# Patient Record
Sex: Female | Born: 1990 | Hispanic: Yes | Marital: Married | Smoking: Never smoker
Health system: Southern US, Community
[De-identification: ages and names within clinical notes are randomized; demographics above are authoritative.]

## PROBLEM LIST (undated history)

## (undated) DIAGNOSIS — F419 Anxiety disorder, unspecified: Secondary | ICD-10-CM

## (undated) DIAGNOSIS — F329 Major depressive disorder, single episode, unspecified: Secondary | ICD-10-CM

## (undated) DIAGNOSIS — E559 Vitamin D deficiency, unspecified: Secondary | ICD-10-CM

## (undated) DIAGNOSIS — N390 Urinary tract infection, site not specified: Secondary | ICD-10-CM

## (undated) DIAGNOSIS — F32A Depression, unspecified: Secondary | ICD-10-CM

## (undated) HISTORY — PX: CHOLECYSTECTOMY: SHX55

## (undated) HISTORY — DX: Anxiety disorder, unspecified: F41.9

## (undated) HISTORY — PX: NO PAST SURGERIES: SHX2092

## (undated) HISTORY — DX: Depression, unspecified: F32.A

---
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
## 2010-06-11 ENCOUNTER — Other Ambulatory Visit (HOSPITAL_COMMUNITY)
Admission: RE | Admit: 2010-06-11 | Discharge: 2010-06-11 | Disposition: A | Discharge status: Home / Self Care | Payer: Self-pay | Source: Ambulatory Visit | Attending: Family Medicine | Admitting: Family Medicine

## 2010-06-11 ENCOUNTER — Encounter: Payer: Self-pay | Admitting: Family Medicine

## 2010-06-11 ENCOUNTER — Ambulatory Visit (INDEPENDENT_AMBULATORY_CARE_PROVIDER_SITE_OTHER): Payer: Self-pay | Admitting: Family Medicine

## 2010-06-11 VITALS — BP 100/68 | HR 84 | Ht 61.5 in | Wt 105.6 lb

## 2010-06-11 DIAGNOSIS — Z01419 Encounter for gynecological examination (general) (routine) without abnormal findings: Secondary | ICD-10-CM | POA: Insufficient documentation

## 2010-06-11 DIAGNOSIS — Z0000 Encounter for general adult medical examination without abnormal findings: Secondary | ICD-10-CM

## 2010-06-11 DIAGNOSIS — Z113 Encounter for screening for infections with a predominantly sexual mode of transmission: Secondary | ICD-10-CM | POA: Insufficient documentation

## 2010-06-11 LAB — HEPATIC FUNCTION PANEL
ALT: 14 U/L (ref 0–35)
AST: 18 U/L (ref 0–37)
Alkaline Phosphatase: 84 U/L (ref 39–117)
Bilirubin, Direct: 0.1 mg/dL (ref 0.0–0.3)
Total Bilirubin: 0.5 mg/dL (ref 0.3–1.2)
Total Protein: 7.2 g/dL (ref 6.0–8.3)

## 2010-06-11 LAB — POCT URINALYSIS DIPSTICK
Bilirubin, UA: NEGATIVE
Blood, UA: NEGATIVE
Glucose, UA: NEGATIVE
Ketones, UA: NEGATIVE
Nitrite, UA: NEGATIVE
Spec Grav, UA: 1.025
pH, UA: 5

## 2010-06-11 LAB — LIPID PANEL
LDL Cholesterol: 78 mg/dL (ref 0–99)
Total CHOL/HDL Ratio: 3
Triglycerides: 36 mg/dL (ref 0.0–149.0)

## 2010-06-11 LAB — CBC WITH DIFFERENTIAL/PLATELET
Basophils Absolute: 0 10*3/uL (ref 0.0–0.1)
Eosinophils Relative: 8.7 % — ABNORMAL HIGH (ref 0.0–5.0)
Hemoglobin: 14.7 g/dL (ref 12.0–15.0)
Lymphocytes Relative: 34.7 % (ref 12.0–46.0)
Monocytes Relative: 5.6 % (ref 3.0–12.0)
Neutro Abs: 3.2 10*3/uL (ref 1.4–7.7)
RDW: 12.9 % (ref 11.5–14.6)
WBC: 6.4 10*3/uL (ref 4.5–10.5)

## 2010-06-11 LAB — BASIC METABOLIC PANEL
BUN: 7 mg/dL (ref 6–23)
CO2: 30 mEq/L (ref 19–32)
Chloride: 102 mEq/L (ref 96–112)
Glucose, Bld: 66 mg/dL — ABNORMAL LOW (ref 70–99)
Potassium: 3.9 mEq/L (ref 3.5–5.1)
Sodium: 140 mEq/L (ref 135–145)

## 2010-06-11 NOTE — Patient Instructions (Signed)
If you decide to start birth control pills, please call [place] so we can start them

## 2010-06-11 NOTE — Progress Notes (Signed)
  Subjective:     Yvette Trevino is a 20 y.o. female and is here for a comprehensive physical exam. The patient reports no problems.  History   Social History  . Marital Status: Single    Spouse Name: N/A    Number of Children: N/A  . Years of Education: N/A   Occupational History  . 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 Smoking status: Never Smoker   . Smokeless tobacco: Never Used  . Alcohol Use: 0.5 oz/week    1 drink(s) per week  . Drug Use: No  . Sexually Active: Yes    Birth Control/ Protection: Condom   Other Topics Concern  . Not on file   Social History Narrative  . No narrative on file   No health maintenance topics applied.  The following portions of the patient's history were reviewed and updated as appropriate: allergies, current medications, past family history, past medical history, past social history, past surgical history and problem list.  Review of Systems A comprehensive review of systems was negative.   Objective:    BP 100/68  Pulse 84  Ht 5' 1.5" (1.562 m)  Wt 105 lb 9.6 oz (47.9 kg)  BMI 19.63 kg/m2  LMP 05/07/2010 General appearance: alert, cooperative, appears stated age and no distress Head: Normocephalic, without obvious abnormality, atraumatic Eyes: conjunctivae/corneas clear. PERRL, EOM's intact. Fundi benign. Ears: normal TM's and external ear canals both ears Nose: Nares normal. Septum midline. Mucosa normal. No drainage or sinus tenderness. Throat: lips, mucosa, and tongue normal; teeth and gums normal Neck: no adenopathy, no carotid bruit, no JVD, supple, symmetrical, trachea midline and thyroid not enlarged, symmetric, no tenderness/mass/nodules Lungs: clear to auscultation bilaterally Breasts: normal appearance, no masses or tenderness Heart: regular rate and rhythm, S1, S2 normal, no murmur, click, rub or gallop Abdomen: soft, non-tender; bowel sounds normal; no masses,  no  organomegaly Pelvic: cervix normal in appearance, external genitalia normal, no adnexal masses or tenderness, no cervical motion tenderness, rectovaginal septum normal, uterus normal size, shape, and consistency and vagina normal without discharge Extremities: extremities normal, atraumatic, no cyanosis or edema Pulses: 2+ and symmetric Skin: Skin color, texture, turgor normal. No rashes or lesions Lymph nodes: Cervical, supraclavicular, and axillary nodes normal. Neurologic: Grossly normal    Assessment:    Healthy female exam.   Plan:  [facility] utd   See After Visit Summary for Counseling Recommendations

## 2010-06-12 LAB — HIV ANTIBODY (ROUTINE TESTING W REFLEX): HIV: NONREACTIVE

## 2010-06-13 LAB — HSV 2 ANTIBODY, IGG: HSV 2 Glycoprotein G Ab, IgG: 0.31 IV

## 2010-06-17 ENCOUNTER — Telehealth: Payer: Self-pay

## 2010-06-17 DIAGNOSIS — IMO0002 Reserved for concepts with insufficient information to code with codable children: Secondary | ICD-10-CM

## 2010-06-17 DIAGNOSIS — R87612 Low grade squamous intraepithelial lesion on cytologic smear of cervix (LGSIL): Secondary | ICD-10-CM

## 2010-06-17 MED ORDER — FLUCONAZOLE 150 MG PO TABS: ORAL_TABLET | ORAL | Status: DC

## 2010-06-17 NOTE — Telephone Encounter (Signed)
Message copied by Almeta Monas on Tue Jun 17, 2010 11:35 AM ------      Message from: Loreen Freud      Created: Thu Jun 12, 2010  1:04 PM       HSV still pending      Eosinophils elevated---may be from allergies      All other labs normal

## 2010-06-17 NOTE — Telephone Encounter (Signed)
Spoke with patient and made her aware of all of her results, I advised she had an abnormal pap and we will need to refer her to the GYN so they can further evaluate the abnormal cells, pt voiced understanding and Diflucan was called to [facility] [address]      KP

## 2010-08-04 ENCOUNTER — Encounter: Payer: Self-pay | Admitting: Family Medicine

## 2010-10-06 ENCOUNTER — Telehealth: Payer: Self-pay | Admitting: Family Medicine

## 2010-10-06 NOTE — Telephone Encounter (Signed)
Send letter as well

## 2010-10-06 NOTE — Telephone Encounter (Signed)
PER [facility], PATIENT WAS A NOSHOW FOR HER APPT, DID NOT RSC'D.  I HAVE LM FOR PATIENT TO CALL ME.

## 2010-10-07 NOTE — Telephone Encounter (Signed)
Patient doesn't like anything mailed to her.     KP

## 2012-10-07 LAB — OB RESULTS CONSOLE ABO/RH: RH Type: POSITIVE

## 2012-10-07 LAB — OB RESULTS CONSOLE RPR: RPR: NONREACTIVE

## 2012-10-07 LAB — OB RESULTS CONSOLE HIV ANTIBODY (ROUTINE TESTING): HIV: NONREACTIVE

## 2012-10-07 LAB — OB RESULTS CONSOLE RUBELLA ANTIBODY, IGM: RUBELLA: IMMUNE

## 2012-10-07 LAB — OB RESULTS CONSOLE ANTIBODY SCREEN: ANTIBODY SCREEN: NEGATIVE

## 2012-10-07 LAB — OB RESULTS CONSOLE HEPATITIS B SURFACE ANTIGEN: HEP B S AG: NEGATIVE

## 2013-03-02 NOTE — L&D Delivery Note (Signed)
Delivery Note  Pt continued pushing well, approx 2hrs when [facility]. FHR cat 2, early variables, occ prolonged decel w good recovery, mod-marked variability, repositioned and 02,  vtx descending well  At 1:50 AM a viable female was delivered via Vaginal, Spontaneous Delivery (Presentation: Right Occiput Anterior). Mild shoulder dystocia <30sec, relieved w mcroberts, infant floppy and no spontaneous respirations, cord immediately doubly clamped and cut and placed on warmer, while NICU team was called, APGAR: 3, 7; weight 6 lb 14.8 oz (3140 g).  Placenta status: Intact, Spontaneous. Cord: 3 vessels with the following complications: Long. Cord pH: arterial 7.271, venous 7.273    Anesthesia: Epidural  Episiotomy: None Lacerations: 1st degree;Labial;Perineal Suture Repair: 3.0 vicryl Est. Blood Loss (mL): 350   Bleeding initially brisk, and responded to fundal massage, after repair completed mild uterine atony noted, cytotec 1000mcg placed rectally, fundus then remaining firm w bleeding controlled   Mom to postpartum.  Baby to Couplet care / Skin to Skin. Pt plans brst/bottle Mom and baby stable in recovery room Routine PP orders   Yvette Trevino 04/30/2013, 5:32 AM

## 2013-03-30 LAB — OB RESULTS CONSOLE GBS: GBS: NEGATIVE

## 2013-03-30 LAB — OB RESULTS CONSOLE GC/CHLAMYDIA
CHLAMYDIA, DNA PROBE: NEGATIVE
Gonorrhea: NEGATIVE

## 2013-04-28 ENCOUNTER — Inpatient Hospital Stay (HOSPITAL_COMMUNITY)
Admission: AD | Admit: 2013-04-28 | Discharge: 2013-04-28 | Disposition: A | Discharge status: Home / Self Care | Payer: BC Managed Care – PPO | Source: Ambulatory Visit | Attending: Obstetrics and Gynecology | Admitting: Obstetrics and Gynecology

## 2013-04-28 ENCOUNTER — Encounter (HOSPITAL_COMMUNITY): Payer: Self-pay | Admitting: *Deleted

## 2013-04-28 DIAGNOSIS — O368190 Decreased fetal movements, unspecified trimester, not applicable or unspecified: Secondary | ICD-10-CM | POA: Insufficient documentation

## 2013-04-28 NOTE — MAU Note (Signed)
Patient states she has not felt fetal movement since last night. States contractions are every 8 minutes with a mucus discharge. No bleeding or leaking fluid.

## 2013-04-28 NOTE — MAU Provider Note (Signed)
History   23yo, G1P0 at 8182w5d presents to MAU with c/o no FM since last night.  [facility] as well as eating/drinking.  Denies VB, LOF, recent fever, resp or GI c/o's, UTI or PIH s/s.    Chief Complaint  Patient presents with  . Decreased Fetal Movement   HPI  OB History   Grav Para Term Preterm Abortions TAB SAB Ect Mult Living   1               Past Medical History  Diagnosis Date  . Medical history non-contributory     Past Surgical History  Procedure Laterality Date  . No past surgeries      Family History  Problem Relation Age of Onset  . Breast cancer Maternal Grandmother   . Cancer Maternal Grandmother     breast    History  Substance Use Topics  . Smoking status: Never Smoker   . Smokeless tobacco: Never Used  . Alcohol Use: 0.5 oz/week    1 drink(s) per week    Allergies: No Known Allergies  Prescriptions prior to admission  Medication Sig Dispense Refill  . Prenatal Vit-Fe Fumarate-FA (PRENATAL MULTIVITAMIN) TABS tablet Take 1 tablet by mouth daily at 12 noon.        ROS ROS: see HPI above, all other systems are negative  Physical Exam   Blood pressure 122/95, temperature 98.8 F (37.1 C), temperature source Oral, resp. rate 16, height 5\' 1"  (1.549 m), weight 143 lb 9.6 oz (65.137 kg), last menstrual period 07/17/2012.  Physical Exam Chest: Clear Heart: RRR Abdomen: gravid, NT Extremities: WNL   FHT: Reactive NST UCs: Occational  ED Course  IUP at 6782w5d Decreased FM  NST reactive  D/c home with precautions F/u 3/4 for an already scheduled ROB   Haroldine LawsOXLEY, Shaunie Boehm CNM, MSN 04/28/2013 4:59 PM

## 2013-04-28 NOTE — Discharge Instructions (Signed)
Fetal Movement Counts Patient Name: __________________________________________________ Patient Due Date: ____________________ Performing a fetal movement count is highly recommended in high-risk pregnancies, but it is good for every pregnant woman to do. Your caregiver may ask you to start counting fetal movements at 28 weeks of the pregnancy. Fetal movements often increase:  After eating a full meal.  After physical activity.  After eating or drinking something sweet or cold.  At rest. Pay attention to when you feel the baby is most active. This will help you notice a pattern of your baby's sleep and wake cycles and what factors contribute to an increase in fetal movement. It is important to perform a fetal movement count at the same time each day when your baby is normally most active.  HOW TO COUNT FETAL MOVEMENTS 1. Find a quiet and comfortable area to sit or lie down on your left side. Lying on your left side provides the best blood and oxygen circulation to your baby. 2. Write down the day and time on a sheet of paper or in a journal. 3. Start counting kicks, flutters, swishes, rolls, or jabs in a 2 hour period. You should feel at least 10 movements within 2 hours. 4. If you do not feel 10 movements in 2 hours, wait 2 3 hours and count again. Look for a change in the pattern or not enough counts in 2 hours. SEEK MEDICAL CARE IF:  You feel less than 10 counts in 2 hours, tried twice.  There is no movement in over an hour.  The pattern is changing or taking longer each day to reach 10 counts in 2 hours.  You feel the baby is not moving as he or she usually does. Date: ____________ Movements: ____________ Start time: ____________ Doreatha Martin time: ____________  Date: ____________ Movements: ____________ Start time: ____________ Doreatha Martin time: ____________ Document Released: 03/18/2006 Document Revised: 02/03/2012 Document Reviewed: 12/14/2011 ExitCare Patient Information 2014 [place],  [place].  Braxton Hicks Contractions Pregnancy is commonly associated with contractions of the uterus throughout the pregnancy. Towards the end of pregnancy (32 to 34 weeks), these contractions [facility] Willa Rough) can develop more often and may become more forceful. This is not true labor because these contractions do not result in opening (dilatation) and thinning of the cervix. They are sometimes difficult to tell apart from true labor because these contractions can be forceful and people have different pain tolerances. You should not feel embarrassed if you go to the hospital with false labor. Sometimes, the only way to tell if you are in true labor is for your caregiver to follow the changes in the cervix. How to tell the difference between true and false labor:  False labor.  The contractions of false labor are usually shorter, irregular and not as hard as those of true labor.  They are often felt in the front of the lower abdomen and in the groin.  They may leave with walking around or changing positions while lying down.  They get weaker and are shorter lasting as time goes on.  These contractions are usually irregular.  They do not usually become progressively stronger, regular and closer together as with true labor.  True labor.  Contractions in true labor last 30 to 70 seconds, become very regular, usually become more intense, and increase in frequency.  They do not go away with walking.  The discomfort is usually felt in the top of the uterus and spreads to the lower abdomen and low back.  True labor can  be determined by your caregiver with an exam. This will show that the cervix is dilating and getting thinner. If there are no prenatal problems or other health problems associated with the pregnancy, it is completely safe to be sent home with false labor and await the onset of true labor. HOME CARE INSTRUCTIONS   Keep up with your usual exercises and instructions.  Take  medications as directed.  Keep your regular prenatal appointment.  Eat and drink lightly if you think you are going into labor.  If BH contractions are making you uncomfortable:  Change your activity position from lying down or resting to walking/walking to resting.  Sit and rest in a tub of warm water.  Drink 2 to 3 glasses of water. Dehydration may cause B-H contractions.  Do slow and deep breathing several times an hour. SEEK IMMEDIATE MEDICAL CARE IF:   Your contractions continue to become stronger, more regular, and closer together.  You have a gushing, burst or leaking of fluid from the vagina.  An oral temperature above 102 F (38.9 C) develops.  You have passage of blood-tinged mucus.  You develop vaginal bleeding.  You develop continuous belly (abdominal) pain.  You have low back pain that you never had before.  You feel the baby's head pushing down causing pelvic pressure.  The baby is not moving as much as it used to. Document Released: 02/16/2005 Document Revised: 05/11/2011 Document Reviewed: 11/28/2012 [facility] Patient Information 2014 [place], [place]

## 2013-04-29 ENCOUNTER — Encounter (HOSPITAL_COMMUNITY): Payer: BC Managed Care – PPO | Admitting: Anesthesiology

## 2013-04-29 ENCOUNTER — Inpatient Hospital Stay (HOSPITAL_COMMUNITY)
Admission: AD | Admit: 2013-04-29 | Discharge: 2013-05-02 | DRG: 774 | Disposition: A | Discharge status: Home / Self Care | Payer: BC Managed Care – PPO | Source: Ambulatory Visit | Attending: Obstetrics and Gynecology | Admitting: Obstetrics and Gynecology

## 2013-04-29 ENCOUNTER — Inpatient Hospital Stay (HOSPITAL_COMMUNITY): Payer: BC Managed Care – PPO | Admitting: Anesthesiology

## 2013-04-29 ENCOUNTER — Encounter (HOSPITAL_COMMUNITY): Payer: Self-pay | Admitting: *Deleted

## 2013-04-29 ENCOUNTER — Inpatient Hospital Stay (HOSPITAL_COMMUNITY)
Admission: AD | Admit: 2013-04-29 | Discharge: 2013-04-29 | Disposition: A | Discharge status: Home / Self Care | Payer: BC Managed Care – PPO | Source: Ambulatory Visit | Attending: Obstetrics and Gynecology | Admitting: Obstetrics and Gynecology

## 2013-04-29 DIAGNOSIS — O411090 Infection of amniotic sac and membranes, unspecified, unspecified trimester, not applicable or unspecified: Secondary | ICD-10-CM | POA: Diagnosis present

## 2013-04-29 DIAGNOSIS — O864 Pyrexia of unknown origin following delivery: Secondary | ICD-10-CM

## 2013-04-29 HISTORY — DX: Urinary tract infection, site not specified: N39.0

## 2013-04-29 HISTORY — DX: Vitamin D deficiency, unspecified: E55.9

## 2013-04-29 LAB — CBC
HEMATOCRIT: 36.5 % (ref 36.0–46.0)
HEMOGLOBIN: 13.4 g/dL (ref 12.0–15.0)
MCH: 32.5 pg (ref 26.0–34.0)
MCHC: 36.7 g/dL — AB (ref 30.0–36.0)
MCV: 88.6 fL (ref 78.0–100.0)
Platelets: 147 10*3/uL — ABNORMAL LOW (ref 150–400)
RBC: 4.12 MIL/uL (ref 3.87–5.11)
RDW: 13.1 % (ref 11.5–15.5)
WBC: 16 10*3/uL — ABNORMAL HIGH (ref 4.0–10.5)

## 2013-04-29 LAB — TYPE AND SCREEN
ABO/RH(D): O POS
ANTIBODY SCREEN: NEGATIVE

## 2013-04-29 LAB — ABO/RH: ABO/RH(D): O POS

## 2013-04-29 LAB — RPR: RPR Ser Ql: NONREACTIVE

## 2013-04-29 MED ORDER — ACETAMINOPHEN 325 MG PO TABS
650.0000 mg | ORAL_TABLET | ORAL | Status: DC | PRN
  Administered 2013-04-29 – 2013-04-30 (×2): 650 mg via ORAL
  Filled 2013-04-29 (×3): qty 2

## 2013-04-29 MED ORDER — PHENYLEPHRINE 40 MCG/ML (10ML) SYRINGE FOR IV PUSH (FOR BLOOD PRESSURE SUPPORT)
80.0000 ug | PREFILLED_SYRINGE | INTRAVENOUS | Status: DC | PRN
  Filled 2013-04-29: qty 10
  Filled 2013-04-29: qty 2

## 2013-04-29 MED ORDER — ZOLPIDEM TARTRATE 5 MG PO TABS
5.0000 mg | ORAL_TABLET | Freq: Once | ORAL | Status: AC
  Administered 2013-04-29: 5 mg via ORAL
  Filled 2013-04-29: qty 1

## 2013-04-29 MED ORDER — EPHEDRINE 5 MG/ML INJ
10.0000 mg | INTRAVENOUS | Status: DC | PRN
  Filled 2013-04-29: qty 4
  Filled 2013-04-29: qty 2

## 2013-04-29 MED ORDER — IBUPROFEN 600 MG PO TABS: 600.0000 mg | ORAL_TABLET | Freq: Four times a day (QID) | ORAL | Status: DC | PRN

## 2013-04-29 MED ORDER — DIPHENHYDRAMINE HCL 50 MG/ML IJ SOLN: 12.5000 mg | INTRAMUSCULAR | Status: DC | PRN

## 2013-04-29 MED ORDER — OXYCODONE-ACETAMINOPHEN 5-325 MG PO TABS: 1.0000 | ORAL_TABLET | ORAL | Status: DC | PRN

## 2013-04-29 MED ORDER — EPHEDRINE 5 MG/ML INJ
10.0000 mg | INTRAVENOUS | Status: DC | PRN
  Filled 2013-04-29: qty 2

## 2013-04-29 MED ORDER — LACTATED RINGERS IV SOLN
500.0000 mL | Freq: Once | INTRAVENOUS | Status: AC
  Administered 2013-04-29: 500 mL via INTRAVENOUS

## 2013-04-29 MED ORDER — FLEET ENEMA 7-19 GM/118ML RE ENEM: 1.0000 | ENEMA | RECTAL | Status: DC | PRN

## 2013-04-29 MED ORDER — FENTANYL 2.5 MCG/ML BUPIVACAINE 1/10 % EPIDURAL INFUSION (WH - ANES)
14.0000 mL/h | INTRAMUSCULAR | Status: DC | PRN
  Administered 2013-04-30: 14 mL/h via EPIDURAL
  Filled 2013-04-29 (×2): qty 125

## 2013-04-29 MED ORDER — BUTORPHANOL TARTRATE 1 MG/ML IJ SOLN
1.0000 mg | INTRAMUSCULAR | Status: DC | PRN
  Administered 2013-04-29: 1 mg via INTRAVENOUS
  Filled 2013-04-29: qty 1

## 2013-04-29 MED ORDER — ONDANSETRON HCL 4 MG/2ML IJ SOLN: 4.0000 mg | Freq: Four times a day (QID) | INTRAMUSCULAR | Status: DC | PRN

## 2013-04-29 MED ORDER — PHENYLEPHRINE 40 MCG/ML (10ML) SYRINGE FOR IV PUSH (FOR BLOOD PRESSURE SUPPORT)
80.0000 ug | PREFILLED_SYRINGE | INTRAVENOUS | Status: DC | PRN
  Filled 2013-04-29: qty 2

## 2013-04-29 MED ORDER — CITRIC ACID-SODIUM CITRATE 334-500 MG/5ML PO SOLN: 30.0000 mL | ORAL | Status: DC | PRN

## 2013-04-29 MED ORDER — LACTATED RINGERS IV SOLN: INTRAVENOUS | Status: DC

## 2013-04-29 MED ORDER — LACTATED RINGERS IV SOLN
INTRAVENOUS | Status: DC
  Administered 2013-04-29 (×3): via INTRAVENOUS

## 2013-04-29 MED ORDER — LIDOCAINE HCL (PF) 1 % IJ SOLN
30.0000 mL | INTRAMUSCULAR | Status: AC | PRN
  Administered 2013-04-30: 30 mL via SUBCUTANEOUS
  Filled 2013-04-29: qty 30

## 2013-04-29 MED ORDER — LACTATED RINGERS IV SOLN
INTRAVENOUS | Status: DC
  Administered 2013-04-29: 18:00:00 via INTRAUTERINE

## 2013-04-29 MED ORDER — OXYTOCIN BOLUS FROM INFUSION
500.0000 mL | INTRAVENOUS | Status: DC
  Administered 2013-04-30: 500 mL via INTRAVENOUS

## 2013-04-29 MED ORDER — LIDOCAINE HCL (PF) 1 % IJ SOLN
INTRAMUSCULAR | Status: DC | PRN
  Administered 2013-04-29 (×2): 4 mL

## 2013-04-29 MED ORDER — LACTATED RINGERS IV SOLN
500.0000 mL | INTRAVENOUS | Status: DC | PRN
  Administered 2013-04-29: 300 mL via INTRAVENOUS
  Administered 2013-04-29: 1000 mL via INTRAVENOUS

## 2013-04-29 MED ORDER — TERBUTALINE SULFATE 1 MG/ML IJ SOLN: 0.2500 mg | Freq: Once | INTRAMUSCULAR | Status: AC | PRN

## 2013-04-29 MED ORDER — OXYTOCIN 40 UNITS IN LACTATED RINGERS INFUSION - SIMPLE MED
62.5000 mL/h | INTRAVENOUS | Status: DC
  Filled 2013-04-29: qty 1000

## 2013-04-29 MED ORDER — OXYTOCIN 40 UNITS IN LACTATED RINGERS INFUSION - SIMPLE MED
1.0000 m[IU]/min | INTRAVENOUS | Status: DC
  Administered 2013-04-29: 1 m[IU]/min via INTRAVENOUS

## 2013-04-29 MED ORDER — FENTANYL 2.5 MCG/ML BUPIVACAINE 1/10 % EPIDURAL INFUSION (WH - ANES)
INTRAMUSCULAR | Status: DC | PRN
  Administered 2013-04-29: 12.5 mL/h via EPIDURAL

## 2013-04-29 NOTE — Progress Notes (Signed)
Gevena BarreS. Lillard CNM in earlier and discussed d/c plan. Written and verbal d/c instructions given and understanding vocied

## 2013-04-29 NOTE — Progress Notes (Signed)
  Subjective: Patient request cervical check.  Discomfort with contractions. Family at bs  Objective: BP 122/83  Pulse 99  Temp(Src) 98 F (36.7 C) (Oral)  Resp 20  Ht 5' (1.524 m)  Wt 141 lb (63.957 kg)  BMI 27.54 kg/m2  LMP 07/17/2012      FHT:  Cat I UC:   Irregular SVE:   2-3/100/-1 Exam by V. Quincie Haroon  Assessment / Plan:  SROM at 0630 Minimal Cervical Change Inadequate Ctx Pattern GBS Neg  Recommend pitocin  Okay for epidural Will reassess in prn   Kinzy Weyers 04/29/2013, 3:03 PM

## 2013-04-29 NOTE — Discharge Instructions (Signed)
Braxton Hicks Contractions Pregnancy is commonly associated with contractions of the uterus throughout the pregnancy. Towards the end of pregnancy (32 to 34 weeks), these contractions (Braxton Hicks) can develop more often and may become more forceful. This is not true labor because these contractions do not result in opening (dilatation) and thinning of the cervix. They are sometimes difficult to tell apart from true labor because these contractions can be forceful and people have different pain tolerances. You should not feel embarrassed if you go to the hospital with false labor. Sometimes, the only way to tell if you are in true labor is for your caregiver to follow the changes in the cervix. How to tell the difference between true and false labor:  False labor.  The contractions of false labor are usually shorter, irregular and not as hard as those of true labor.  They are often felt in the front of the lower abdomen and in the groin.  They may leave with walking around or changing positions while lying down.  They get weaker and are shorter lasting as time goes on.  These contractions are usually irregular.  They do not usually become progressively stronger, regular and closer together as with true labor.  True labor.  Contractions in true labor last 30 to 70 seconds, become very regular, usually become more intense, and increase in frequency.  They do not go away with walking.  The discomfort is usually felt in the top of the uterus and spreads to the lower abdomen and low back.  True labor can be determined by your caregiver with an exam. This will show that the cervix is dilating and getting thinner. If there are no prenatal problems or other health problems associated with the pregnancy, it is completely safe to be sent home with false labor and await the onset of true labor. HOME CARE INSTRUCTIONS   Keep up with your usual exercises and instructions.  Take medications as  directed.  Keep your regular prenatal appointment.  Eat and drink lightly if you think you are going into labor.  If BH contractions are making you uncomfortable:  Change your activity position from lying down or resting to walking/walking to resting.  Sit and rest in a tub of warm water.  Drink 2 to 3 glasses of water. Dehydration may cause B-H contractions.  Do slow and deep breathing several times an hour. SEEK IMMEDIATE MEDICAL CARE IF:   Your contractions continue to become stronger, more regular, and closer together.  You have a gushing, burst or leaking of fluid from the vagina.  An oral temperature above 102 F (38.9 C) develops.  You have passage of blood-tinged mucus.  You develop vaginal bleeding.  You develop continuous belly (abdominal) pain.  You have low back pain that you never had before.  You feel the baby's head pushing down causing pelvic pressure.  The baby is not moving as much as it used to. Document Released: 02/16/2005 Document Revised: 05/11/2011 Document Reviewed: 11/28/2012 ExitCare Patient Information 2014 [employer].  Fetal Movement Counts Patient Name: __________________________________________________ Patient Due Date: ____________________ Performing a fetal movement count is highly recommended in high-risk pregnancies, but it is good for every pregnant woman to do. Your caregiver may ask you to start counting fetal movements at 28 weeks of the pregnancy. Fetal movements often increase:  After eating a full meal.  After physical activity.  After eating or drinking something sweet or cold.  At rest. Pay attention to when you feel   the baby is most active. This will help you notice a pattern of your baby's sleep and wake cycles and what factors contribute to an increase in fetal movement. It is important to perform a fetal movement count at the same time each day when your baby is normally most active.  HOW TO COUNT FETAL  MOVEMENTS 1. Find a quiet and comfortable area to sit or lie down on your left side. Lying on your left side provides the best blood and oxygen circulation to your baby. 2. Write down the day and time on a sheet of paper or in a journal. 3. Start counting kicks, flutters, swishes, rolls, or jabs in a 2 hour period. You should feel at least 10 movements within 2 hours. 4. If you do not feel 10 movements in 2 hours, wait 2 3 hours and count again. Look for a change in the pattern or not enough counts in 2 hours. SEEK MEDICAL CARE IF:  You feel less than 10 counts in 2 hours, tried twice.  There is no movement in over an hour.  The pattern is changing or taking longer each day to reach 10 counts in 2 hours.  You feel the baby is not moving as he or she usually does. Date: ____________ Movements: ____________ Start time: ____________ Finish time: ____________  Date: ____________ Movements: ____________ Start time: ____________ Finish time: ____________ Date: ____________ Movements: ____________ Start time: ____________ Finish time: ____________ Date: ____________ Movements: ____________ Start time: ____________ Finish time: ____________ Date: ____________ Movements: ____________ Start time: ____________ Finish time: ____________ Date: ____________ Movements: ____________ Start time: ____________ Finish time: ____________ Date: ____________ Movements: ____________ Start time: ____________ Finish time: ____________ Date: ____________ Movements: ____________ Start time: ____________ Finish time: ____________  Date: ____________ Movements: ____________ Start time: ____________ Finish time: ____________ Date: ____________ Movements: ____________ Start time: ____________ Finish time: ____________ Date: ____________ Movements: ____________ Start time: ____________ Finish time: ____________ Date: ____________ Movements: ____________ Start time: ____________ Finish time: ____________ Date: ____________  Movements: ____________ Start time: ____________ Finish time: ____________ Date: ____________ Movements: ____________ Start time: ____________ Finish time: ____________ Date: ____________ Movements: ____________ Start time: ____________ Finish time: ____________  Date: ____________ Movements: ____________ Start time: ____________ Finish time: ____________ Date: ____________ Movements: ____________ Start time: ____________ Finish time: ____________ Date: ____________ Movements: ____________ Start time: ____________ Finish time: ____________ Date: ____________ Movements: ____________ Start time: ____________ Finish time: ____________ Date: ____________ Movements: ____________ Start time: ____________ Finish time: ____________ Date: ____________ Movements: ____________ Start time: ____________ Finish time: ____________ Date: ____________ Movements: ____________ Start time: ____________ Finish time: ____________  Date: ____________ Movements: ____________ Start time: ____________ Finish time: ____________ Date: ____________ Movements: ____________ Start time: ____________ Finish time: ____________ Date: ____________ Movements: ____________ Start time: ____________ Finish time: ____________ Date: ____________ Movements: ____________ Start time: ____________ Finish time: ____________ Date: ____________ Movements: ____________ Start time: ____________ Finish time: ____________ Date: ____________ Movements: ____________ Start time: ____________ Finish time: ____________ Date: ____________ Movements: ____________ Start time: ____________ Finish time: ____________  Date: ____________ Movements: ____________ Start time: ____________ Finish time: ____________ Date: ____________ Movements: ____________ Start time: ____________ Finish time: ____________ Date: ____________ Movements: ____________ Start time: ____________ Finish time: ____________ Date: ____________ Movements: ____________ Start time:  ____________ Finish time: ____________ Date: ____________ Movements: ____________ Start time: ____________ Finish time: ____________ Date: ____________ Movements: ____________ Start time: ____________ Finish time: ____________ Date: ____________ Movements: ____________ Start time: ____________ Finish time: ____________  Date: ____________ Movements: ____________ Start time: ____________ Finish time: ____________ Date: ____________ Movements: ____________ Start   time: ____________ Finish time: ____________ Date: ____________ Movements: ____________ Start time: ____________ Finish time: ____________ Date: ____________ Movements: ____________ Start time: ____________ Finish time: ____________ Date: ____________ Movements: ____________ Start time: ____________ Finish time: ____________ Date: ____________ Movements: ____________ Start time: ____________ Finish time: ____________ Date: ____________ Movements: ____________ Start time: ____________ Finish time: ____________  Date: ____________ Movements: ____________ Start time: ____________ Finish time: ____________ Date: ____________ Movements: ____________ Start time: ____________ Finish time: ____________ Date: ____________ Movements: ____________ Start time: ____________ Finish time: ____________ Date: ____________ Movements: ____________ Start time: ____________ Finish time: ____________ Date: ____________ Movements: ____________ Start time: ____________ Finish time: ____________ Date: ____________ Movements: ____________ Start time: ____________ Finish time: ____________ Date: ____________ Movements: ____________ Start time: ____________ Finish time: ____________  Date: ____________ Movements: ____________ Start time: ____________ Finish time: ____________ Date: ____________ Movements: ____________ Start time: ____________ Finish time: ____________ Date: ____________ Movements: ____________ Start time: ____________ Finish time: ____________ Date:  ____________ Movements: ____________ Start time: ____________ Finish time: ____________ Date: ____________ Movements: ____________ Start time: ____________ Finish time: ____________ Date: ____________ Movements: ____________ Start time: ____________ Finish time: ____________ Document Released: 03/18/2006 Document Revised: 02/03/2012 Document Reviewed: 12/14/2011 ExitCare Patient Information 2014 [employer], [employer].  

## 2013-04-29 NOTE — Progress Notes (Signed)
Shanda BumpsJessica, RN answering for [name], notified of pt's exam, sending to room 172.

## 2013-04-29 NOTE — Progress Notes (Signed)
Patient ID: Yvette Trevino, female   DOB: 1990-08-17, 23 y.o.   MRN: [id number] Yvette Trevino is a 23 y.o. G1P0 at 6285w6d admitted for labor  Subjective: Comfortable w epidural, feeling some pressure on L side, [facility]  Objective: BP 128/82  Pulse 109  Temp(Src) 99 F (37.2 C) (Oral)  Resp 18  Ht 5' (1.524 m)  Wt 141 lb (63.957 kg)  BMI 27.54 kg/m2  SpO2 99%  LMP 07/17/2012     FHT:  Cat 1, occ mild variable  UC:   IUPC not tracing well, appears to be adequate   SVE:   Dilation: Lip/rim Effacement (%): 100 Station: +1 Exam by:: s. Mattalyn Anderegg, cnm  Anterior lip slightly edematous   Assessment / Plan:  Labor: Progressing normally Preeclampsia:  no s/s Fetal Wellbeing:  Category I Pain Control:  Epidural Anticipated MOD:  NSVD  Will labor down Recheck w urge to push   Update physician PRN   Malissa HippoLILLARD,Raegyn Renda M 04/29/2013, 9:20 PM

## 2013-04-29 NOTE — MAU Note (Signed)
Pt states ROM around 0630, clear fluid.

## 2013-04-29 NOTE — Anesthesia Preprocedure Evaluation (Signed)
Anesthesia Evaluation  Patient identified by MRN, date of birth, ID band Patient awake    Reviewed: Allergy & Precautions, H&P , Patient's Chart, lab work & pertinent test results  Airway Mallampati: III TM Distance: >3 FB Neck ROM: Full    Dental no notable dental hx. (+) Teeth Intact   Pulmonary neg pulmonary ROS,  breath sounds clear to auscultation  Pulmonary exam normal       Cardiovascular negative cardio ROS  Rhythm:Regular Rate:Normal     Neuro/Psych negative neurological ROS  negative psych ROS   GI/Hepatic Neg liver ROS, GERD-  Medicated and Controlled,  Endo/Other  negative endocrine ROS  Renal/GU negative Renal ROS  negative genitourinary   Musculoskeletal negative musculoskeletal ROS (+)   Abdominal   Peds  Hematology negative hematology ROS (+)   Anesthesia Other Findings   Reproductive/Obstetrics (+) Pregnancy                           Anesthesia Physical Anesthesia Plan  ASA: II  Anesthesia Plan: Epidural   Post-op Pain Management:    Induction:   Airway Management Planned: Natural Airway  Additional Equipment:   Intra-op Plan:   Post-operative Plan:   Informed Consent: I have reviewed the patients History and Physical, chart, labs and discussed the procedure including the risks, benefits and alternatives for the proposed anesthesia with the patient or authorized representative who has indicated his/her understanding and acceptance.     Plan Discussed with: Anesthesiologist  Anesthesia Plan Comments:         Anesthesia Quick Evaluation

## 2013-04-29 NOTE — H&P (Signed)
Yvette Trevino is a 23 y.o. female, G1P0 at 40.6 weeks, presenting for SROM.  Patient Active Problem List   Diagnosis Date Noted  . Normal labor 04/29/2013    History of present pregnancy: Patient entered care at 12.4 weeks.   EDC of 04/23/2013 was established by LMP of 07/17/2012.   Anatomy scan:  21.3 weeks, with normal findings and an posterior placenta.   Additional US evaluations:  None.   Significant prenatal events: 95k Ecoli Urine at NOB, 40k multi repeat culture -Declined Tdap & Flu Vaccine   Last evaluation:  04/28/2013-1/40%/-3 Membrane Sweeping performed  OB History   Grav Para Term Preterm Abortions TAB SAB Ect Mult Living   1              Past Medical History  Diagnosis Date  . Medical history non-contributory    Past Surgical History  Procedure Laterality Date  . No past surgeries     Family History: family history includes Breast cancer in her maternal grandmother; Cancer in her maternal grandmother. Social History:  reports that she has never smoked. She has never used smokeless tobacco. She reports that she drinks about 0.5 ounces of alcohol per week. She reports that she does not use illicit drugs.   Prenatal Transfer Tool  Maternal Diabetes: No Genetic Screening: Declined Maternal Ultrasounds/Referrals: Normal Fetal Ultrasounds or other Referrals:  None Maternal Substance Abuse:  No Significant Maternal Medications:  None Significant Maternal Lab Results: Lab values include: Group B Strep negative    ROS:  +Ctx, +LOF, +FM  No Known Allergies   Dilation: 2.5 Effacement (%): 80 Station: -2 Exam by:: Marty HeckS. Beck, RN  Blood pressure 117/88, pulse 106, resp. rate 20, last menstrual period 07/17/2012.  Chest clear Heart RRR without murmur Abd gravid, NT, FH 39 Pelvic: See Above-leaking clear fluid Ext: WNL  FHR: Cat I  UCs:  3-4  Prenatal labs: ABO, Rh: O/Positive/-- (08/08 0000) Antibody: Negative (08/08 0000) Rubella:   Immune RPR:  Nonreactive (08/08 0000)  HBsAg: Negative (08/08 0000)  HIV: Non-reactive (08/08 0000)  GBS: Negative (01/29 0000) Sickle cell/Hgb electrophoresis:  Norm Pap:  Norm 10/13/2012 GC:  Neg Chlamydia:  Neg  Genetic screenings:  Declined Glucola: 107 Other:  Vit D Normal    Assessment/Plan: IUP at 40.6wks SROM clear fluid GBS Negative Early Labor  Plan: Admit to [facility] per consult with Dr. Kathie RhodesS. Rivard Routine [facility] orders Pain med prn  Maryfrances Portugal, [place], [place] 04/29/2013, 10:08 AM

## 2013-04-29 NOTE — Anesthesia Procedure Notes (Signed)
Epidural Patient location during procedure: OB Start time: 04/29/2013 3:47 PM  Staffing Anesthesiologist: Meganne Rita A. Performed by: anesthesiologist   Preanesthetic Checklist Completed: patient identified, site marked, surgical consent, pre-op evaluation, timeout performed, IV checked, risks and benefits discussed and monitors and equipment checked  Epidural Patient position: sitting Prep: site prepped and draped and DuraPrep Patient monitoring: continuous pulse ox and blood pressure Approach: midline Injection technique: LOR air  Needle:  Needle type: Tuohy  Needle gauge: 17 G Needle length: 9 cm and 9 Needle insertion depth: 5 cm cm Catheter type: closed end flexible Catheter size: 19 Gauge Catheter at skin depth: 10 cm Test dose: negative and Other  Assessment Events: blood not aspirated, injection not painful, no injection resistance, negative IV test and no paresthesia  Additional Notes Patient identified. Risks and benefits discussed including failed block, incomplete  Pain control, post dural puncture headache, nerve damage, paralysis, blood pressure Changes, nausea, vomiting, reactions to medications-both toxic and allergic and post Partum back pain. All questions were answered. Patient expressed understanding and wished to proceed. Sterile technique was used throughout procedure. Epidural site was Dressed with sterile barrier dressing. No paresthesias, signs of intravascular injection Or signs of intrathecal spread were encountered.  Patient was more comfortable after the epidural was dosed. Please see RN's note for documentation of vital signs and FHR which are stable.

## 2013-04-29 NOTE — MAU Note (Signed)
Up to BR.

## 2013-04-29 NOTE — MAU Provider Note (Signed)
  History     CSN: [id number]  Arrival date and time: 04/29/13 [id number]   First Provider Initiated Contact with Patient 04/29/13 0327      Chief Complaint  Patient presents with  . Contractions   HPI Comments: Pt is a 23yo G1P0 at 4656w6d arrives unannounced w c/o regular ctx since earlier this evening, denies LOF or VB, GFM. Pt seen earlier today for dec FM w reactive NST.        Past Medical History  Diagnosis Date  . Medical history non-contributory     Past Surgical History  Procedure Laterality Date  . No past surgeries      Family History  Problem Relation Age of Onset  . Breast cancer Maternal Grandmother   . Cancer Maternal Grandmother     breast    History  Substance Use Topics  . Smoking status: Never Smoker   . Smokeless tobacco: Never Used  . Alcohol Use: 0.5 oz/week    1 drink(s) per week    Allergies: No Known Allergies  Prescriptions prior to admission  Medication Sig Dispense Refill  . Prenatal Vit-Fe Fumarate-FA (PRENATAL MULTIVITAMIN) TABS tablet Take 1 tablet by mouth daily at 12 noon.        Review of Systems  All other systems reviewed and are negative.   Physical Exam   Blood pressure 121/70, pulse 79, temperature 98.5 F (36.9 C), resp. rate 22, height 5' (1.524 m), weight 141 lb 6.4 oz (64.139 kg), last menstrual period 07/17/2012.  Physical Exam  Nursing note and vitals reviewed. Constitutional: She is oriented to person, place, and time. She appears well-developed and well-nourished.  HENT:  Head: Normocephalic.  Eyes: Pupils are equal, round, and reactive to light.  Neck: Normal range of motion.  Cardiovascular: Normal rate, regular rhythm and normal heart sounds.   Respiratory: Effort normal and breath sounds normal.  GI: Soft. Bowel sounds are normal.  Genitourinary: Vagina normal.  Musculoskeletal: Normal range of motion.  Neurological: She is alert and oriented to person, place, and time. She has normal reflexes.  Skin:  Skin is warm and dry.  Psychiatric: She has a normal mood and affect. Her behavior is normal.    MAU Course  Procedures    Assessment and Plan  IUP at 8156w6d ?prodromal labor Cervix unchanged from office 1.5/75/-2 Will observe and recheck in about an hour  Kirstan Fentress M 04/29/2013, 3:47 AM   Addendum: at [id number]  S: pt denies need for pain meds, sleepy  O: FHR cat 1, VSS Cervix unchanged  A: IUP at 5156w6d Not in active labor  P: discharge home rv'd labor sx's and [facility]KC Keep f/u next week  S.Johanny Segers, CNM

## 2013-04-29 NOTE — Progress Notes (Signed)
Dr Malen GauzeFoster coming to evaluate IV.

## 2013-04-29 NOTE — MAU Note (Signed)
I've been contracting since 2000. Some mucousy d/c but no bleeding or leaking fld

## 2013-04-29 NOTE — Progress Notes (Signed)
Gevena BarreS. Lillard CNM called and aware of ctx pattern and flat FHR strip. Will come reck pt and review FM strip

## 2013-04-29 NOTE — Progress Notes (Signed)
  Subjective: Patient comfortable with epidural.  Family at bs  Objective: BP 105/68  Pulse 89  Temp(Src) 98.6 F (37 C) (Oral)  Resp 18  Ht 5' (1.524 m)  Wt 141 lb (63.957 kg)  BMI 27.54 kg/m2  SpO2 99%  LMP 07/17/2012      FHT:  Cat 2 w/ sporadic variables w/ctx. Positive scalp stim w/exam. Period of exaggerated variability after exam x527min. Now to baseline 120-130 w/ mod. Variability. Variable noted w/ctx UC:   irregular  SVE:   5/80/-1 Pitocin at 4.495mUn/min IUPC & FSE placed, patient tolerated well Amnioinfusion initiated  Assessment / Plan: Progressive labor  Will continue to observe FHR pattern Continue pitocin  Julann Mcgilvray 04/29/2013, 5:52 PM

## 2013-04-30 ENCOUNTER — Encounter (HOSPITAL_COMMUNITY): Payer: Self-pay

## 2013-04-30 DIAGNOSIS — O864 Pyrexia of unknown origin following delivery: Secondary | ICD-10-CM

## 2013-04-30 LAB — CBC
HCT: 36.2 % (ref 36.0–46.0)
HEMOGLOBIN: 12.8 g/dL (ref 12.0–15.0)
MCH: 31.8 pg (ref 26.0–34.0)
MCHC: 35.4 g/dL (ref 30.0–36.0)
MCV: 89.8 fL (ref 78.0–100.0)
PLATELETS: 144 10*3/uL — AB (ref 150–400)
RBC: 4.03 MIL/uL (ref 3.87–5.11)
RDW: 13.2 % (ref 11.5–15.5)
WBC: 22.5 10*3/uL — ABNORMAL HIGH (ref 4.0–10.5)

## 2013-04-30 MED ORDER — DIPHENHYDRAMINE HCL 25 MG PO CAPS: 25.0000 mg | ORAL_CAPSULE | Freq: Four times a day (QID) | ORAL | Status: DC | PRN

## 2013-04-30 MED ORDER — LANOLIN HYDROUS EX OINT: TOPICAL_OINTMENT | CUTANEOUS | Status: DC | PRN

## 2013-04-30 MED ORDER — MISOPROSTOL 200 MCG PO TABS
1000.0000 ug | ORAL_TABLET | Freq: Once | ORAL | Status: AC
  Administered 2013-04-30: 1000 ug via RECTAL

## 2013-04-30 MED ORDER — TETANUS-DIPHTH-ACELL PERTUSSIS 5-2.5-18.5 LF-MCG/0.5 IM SUSP: 0.5000 mL | Freq: Once | INTRAMUSCULAR | Status: DC

## 2013-04-30 MED ORDER — LACTATED RINGERS IV BOLUS (SEPSIS)
500.0000 mL | Freq: Once | INTRAVENOUS | Status: AC
  Administered 2013-04-30: 500 mL via INTRAVENOUS

## 2013-04-30 MED ORDER — ONDANSETRON HCL 4 MG/2ML IJ SOLN: 4.0000 mg | INTRAMUSCULAR | Status: DC | PRN

## 2013-04-30 MED ORDER — WITCH HAZEL-GLYCERIN EX PADS: 1.0000 "application " | MEDICATED_PAD | CUTANEOUS | Status: DC | PRN

## 2013-04-30 MED ORDER — ONDANSETRON HCL 4 MG PO TABS: 4.0000 mg | ORAL_TABLET | ORAL | Status: DC | PRN

## 2013-04-30 MED ORDER — MEDROXYPROGESTERONE ACETATE 150 MG/ML IM SUSP: 150.0000 mg | INTRAMUSCULAR | Status: DC | PRN

## 2013-04-30 MED ORDER — DIBUCAINE 1 % RE OINT: 1.0000 "application " | TOPICAL_OINTMENT | RECTAL | Status: DC | PRN

## 2013-04-30 MED ORDER — MEASLES, MUMPS & RUBELLA VAC ~~LOC~~ INJ: 0.5000 mL | INJECTION | Freq: Once | SUBCUTANEOUS | Status: DC

## 2013-04-30 MED ORDER — IBUPROFEN 600 MG PO TABS
600.0000 mg | ORAL_TABLET | Freq: Four times a day (QID) | ORAL | Status: DC
  Administered 2013-04-30 – 2013-05-02 (×9): 600 mg via ORAL
  Filled 2013-04-30 (×9): qty 1

## 2013-04-30 MED ORDER — FLEET ENEMA 7-19 GM/118ML RE ENEM: 1.0000 | ENEMA | Freq: Every day | RECTAL | Status: DC | PRN

## 2013-04-30 MED ORDER — AMPICILLIN-SULBACTAM SODIUM 3 (2-1) G IJ SOLR
3.0000 g | Freq: Four times a day (QID) | INTRAMUSCULAR | Status: AC
  Administered 2013-04-30 – 2013-05-01 (×4): 3 g via INTRAVENOUS
  Filled 2013-04-30 (×4): qty 3

## 2013-04-30 MED ORDER — OXYCODONE-ACETAMINOPHEN 5-325 MG PO TABS: 1.0000 | ORAL_TABLET | ORAL | Status: DC | PRN

## 2013-04-30 MED ORDER — MISOPROSTOL 200 MCG PO TABS
ORAL_TABLET | ORAL | Status: AC
  Administered 2013-04-30: 1000 ug via RECTAL
  Filled 2013-04-30: qty 5

## 2013-04-30 MED ORDER — BISACODYL 10 MG RE SUPP: 10.0000 mg | Freq: Every day | RECTAL | Status: DC | PRN

## 2013-04-30 MED ORDER — ZOLPIDEM TARTRATE 5 MG PO TABS: 5.0000 mg | ORAL_TABLET | Freq: Every evening | ORAL | Status: DC | PRN

## 2013-04-30 MED ORDER — SIMETHICONE 80 MG PO CHEW: 80.0000 mg | CHEWABLE_TABLET | ORAL | Status: DC | PRN

## 2013-04-30 MED ORDER — PRENATAL MULTIVITAMIN CH
1.0000 | ORAL_TABLET | Freq: Every day | ORAL | Status: DC
  Administered 2013-04-30 – 2013-05-01 (×2): 1 via ORAL
  Filled 2013-04-30 (×2): qty 1

## 2013-04-30 MED ORDER — SENNOSIDES-DOCUSATE SODIUM 8.6-50 MG PO TABS
2.0000 | ORAL_TABLET | ORAL | Status: DC
  Administered 2013-04-30 – 2013-05-01 (×2): 2 via ORAL
  Filled 2013-04-30 (×2): qty 2

## 2013-04-30 MED ORDER — BENZOCAINE-MENTHOL 20-0.5 % EX AERO
1.0000 "application " | INHALATION_SPRAY | CUTANEOUS | Status: DC | PRN
  Administered 2013-05-01: 1 via TOPICAL
  Filled 2013-04-30: qty 56

## 2013-04-30 NOTE — Lactation Note (Signed)
This note was copied from the chart of Yvette Trevino. Lactation Consultation Note: initial visit made with mom. Baby now 2313 hours old. Mom reports that he fed for 20 minutes about 1 1/2 hours ago.  Baby asleep at present. Several visitors present in room. Reports that it did hurt some while he was nursing- encouraged to wait for wide open mouth and keep baby close to the breast throughout the feeding.  No questions at present. To call for assist with latch prn.  Patient Name: Yvette Yvette Trevino WUJWJ'XToday's Date: 04/30/2013 Reason for consult: Initial assessment   Maternal Data   Feeding   LATCH Score/Interventions                      Lactation Tools Discussed/Used     Consult Status Consult Status: Follow-up Date: 05/01/13 Follow-up type: In-patient    Pamelia HoitWeeks, Joelle Flessner D 04/30/2013, 3:09 PM

## 2013-04-30 NOTE — Anesthesia Postprocedure Evaluation (Signed)
Anesthesia Post Note  Patient: Yvette Trevino  Procedure(s) Performed: * No procedures listed *  Anesthesia type: Epidural  Patient location: Mother/Baby  Post pain: Pain level controlled  Post assessment: Post-op Vital signs reviewed  Last Vitals:  Filed Vitals:   04/30/13 0656  BP:   Pulse:   Temp: 37.3 C  Resp:     Post vital signs: Reviewed  Level of consciousness:alert  Complications: No apparent anesthesia complications

## 2013-04-30 NOTE — Lactation Note (Signed)
This note was copied from the chart of Boy Cathlean CowerLesley Gerardo. Lactation Consultation Note: Attempt to make initial visit with mom but she is asleep at present. Talked with Dad and he reports that baby nursed well earlier this morning but has been sleepy since. Reviewed feeding cues with Dad and encouraged him to wake mom whenever baby is showing feeding cues. BF brochure left in room. To call for assist prn.  Patient Name: Boy Eustaquio BoydenLesley Asher QIONG'EToday's Date: 04/30/2013 Reason for consult: Initial assessment   Maternal Data Formula Feeding for Exclusion: Yes Reason for exclusion: Mother's choice to formula and breast feed on admission Infant to breast within first hour of birth: No Breastfeeding delayed due to:: Infant status Does the patient have breastfeeding experience prior to this delivery?: No  Feeding Feeding Type: Breast Fed Length of feed: 0 min (sleepy, unable to latch:pt asked to call nurse to help latch)  LATCH Score/Interventions                      Lactation Tools Discussed/Used     Consult Status Consult Status: Follow-up Date: 05/01/13 Follow-up type: In-patient    Pamelia HoitWeeks, Maryse Brierley D 04/30/2013, 1:11 PM

## 2013-04-30 NOTE — Progress Notes (Signed)
Post Partum Day 0:S/P SVB, 1st degree perineal/labial lac, mild PP bleeding Subjective: Patient up ad lib, denies syncope or dizziness.  Denies chest pain or SOB. Feeding:  Breast and bottle Contraceptive plan:   Undecided at present  Objective: Blood pressure 116/73, pulse 116, temperature 99.1 F (37.3 C), temperature source Oral, resp. rate 18, height 5' (1.524 m), weight 141 lb (63.957 kg), last menstrual period 07/17/2012, SpO2 96.00%, unknown if currently breastfeeding.  Pulse slightly irregular--no murmur, chest pain, SOB, or any other issue.  Temp max 100.9 at 0515--Rx'd Unasyn 3 mg x 4 doses. F/u temp 99.1 at 0656  Physical Exam:  General: alert Lochia: appropriate Uterine Fundus: firm Incision: healing well DVT Evaluation: No evidence of DVT seen on physical exam. Negative Homan's sign.   Recent Labs  04/29/13 1045 04/30/13 0613  HGB 13.4 12.8  HCT 36.5 36.2    Assessment/Plan: S/P Vaginal delivery day 0 PP temp--on Unasyn x 24 hours Continue current care Patient to continue to hydrate.    LOS: 1 day   Yvette Trevino, [name] 04/30/2013, 8:53 AM

## 2013-04-30 NOTE — Progress Notes (Signed)
Patient ID: Yvette BoydenLesley Trevino, female   DOB: 17-Oct-1990, 23 y.o.   MRN: [id number] Yvette Trevino is a 23 y.o. G1P1001 at 3928w0d admitted for labor  Subjective: Feeling pressure, denies significant pain  Objective: BP 134/82  Pulse 81  Temp(Src) 99.7 F (37.6 C) (Oral)  Resp 18  Ht 5' (1.524 m)  Wt 141 lb (63.957 kg)  BMI 27.54 kg/m2  SpO2 98%  LMP 07/17/2012  Breastfeeding? Unknown  Total I/O In: -  Out: 1300 [Urine:950; Blood:350]  FHT:  Cat 2, early variables, occ prolonged decel w good recovery and mod variability, +accels UC:   toco 2-3   SVE:   Dilation: 10 Effacement (%): 100 Station: +2 Exam by:: e. poore ,rn    Assessment / Plan:  Labor: Progressing normally Preeclampsia:  no s/s Fetal Wellbeing:  Category II Pain Control:  Epidural Anticipated MOD:  NSVD  Pushing about 30 min, vtx visible at introitus  Position changes, 02, IVF bolus  Dr Estanislado Pandyrivard, updated at about 1230am    Yvette Trevino M 04/30/2013, 5:17 AM

## 2013-05-01 NOTE — Lactation Note (Signed)
This note was copied from the chart of Boy Cathlean CowerLesley Casale. Lactation Consultation Note Follow up consult:  Baby 33 hours old.  Mother concerned that she does not have enough milk for her baby.  Reviewed size of babies stomach.  Taught mother hand expression and mother has good flow of colostrum.  Baby has been cluster feeding and explained that this is normal behavior.  Reviewed comfort gels and hand expressed milk on nipples for soreness.  Encouraged mother to call if further assistance if needed.   Patient Name: Boy Eustaquio BoydenLesley Tellefsen ZOXWR'UToday's Date: 05/01/2013 Reason for consult: Follow-up assessment   Maternal Data Has patient been taught Hand Expression?: Yes  Feeding Feeding Type: Breast Fed Length of feed: 30 min  LATCH Score/Interventions Latch: Grasps breast easily, tongue down, lips flanged, rhythmical sucking.  Audible Swallowing: A few with stimulation  Type of Nipple: Everted at rest and after stimulation  Comfort (Breast/Nipple): Soft / non-tender     Hold (Positioning): No assistance needed to correctly position infant at breast.  LATCH Score: 9  Lactation Tools Discussed/Used     Consult Status Date: 05/02/13 Follow-up type: In-patient    Dahlia ByesBerkelhammer, Vidhi Delellis [facility] 05/01/2013, 10:54 AM

## 2013-05-01 NOTE — Progress Notes (Signed)
Post Partum Day1 Subjective: no complaints, voiding and tolerating PO and breast feeding  Objective: Afebrile VSS BP 100/62  Pulse 93  Temp(Src) 97.6 F (36.4 C) (Oral)  Resp 18  Ht 5' (1.524 m)  Wt 141 lb (63.957 kg)  BMI 27.54 kg/m2  SpO2 99%  LMP 07/17/2012  Breastfeeding? Unknown   Physical Exam:  General: alert Lochia: appropriate Uterine Fundus: firm CV RRR Lungs CTA B DVT Evaluation: No evidence of DVT seen on physical exam.   Recent Labs  04/29/13 1045 04/30/13 0613  HGB 13.4 12.8  HCT 36.5 36.2    Assessment/Plan: PPD 1.  Completed unasyn for chorioamnionitis.  Now afebrile PtBF feeding Routine care   LOS: 2 days   Yvette Trevino A 05/01/2013, 12:39 PM

## 2013-05-02 NOTE — Discharge Instructions (Signed)
Vaginal Delivery °Care After °Refer to this sheet in the next few weeks. These discharge instructions provide you with information on caring for yourself after delivery. Your caregiver may also give you specific instructions. Your treatment has been planned according to the most current medical practices available, but problems sometimes occur. Call your caregiver if you have any problems or questions after you go home. °HOME CARE INSTRUCTIONS °· Take over-the-counter or prescription medicines only as directed by your caregiver or pharmacist. °· Do not drink alcohol, especially if you are breastfeeding or taking medicine to relieve pain. °· Do not chew or smoke tobacco. °· Do not use illegal drugs. °· Continue to use good perineal care. Good perineal care includes: °· Wiping your perineum from front to back. °· Keeping your perineum clean. °· Do not use tampons or douche until your caregiver says it is okay. °· Shower, wash your hair, and take tub baths as directed by your caregiver. °· Wear a well-fitting bra that provides breast support. °· Eat healthy foods. °· Drink enough fluids to keep your urine clear or pale yellow. °· Eat high-fiber foods such as whole grain cereals and breads, brown rice, beans, and fresh fruits and vegetables every day. These foods may help prevent or relieve constipation. °· Follow your cargiver's recommendations regarding resumption of activities such as climbing stairs, driving, lifting, exercising, or traveling. °· Talk to your caregiver about resuming sexual activities. Resumption of sexual activities is dependent upon your risk of infection, your rate of healing, and your comfort and desire to resume sexual activity. °· Try to have someone help you with your household activities and your newborn for at least a few days after you leave the hospital. °· Rest as much as possible. Try to rest or take a nap when your newborn is sleeping. °· Increase your activities gradually. °· Keep all  of your scheduled postpartum appointments. It is very important to keep your scheduled follow-up appointments. At these appointments, your caregiver will be checking to make sure that you are healing physically and emotionally. °SEEK MEDICAL CARE IF:  °· You are passing large clots from your vagina. Save any clots to show your caregiver. °· You have a foul smelling discharge from your vagina. °· You have trouble urinating. °· You are urinating frequently. °· You have pain when you urinate. °· You have a change in your bowel movements. °· You have increasing redness, pain, or swelling near your vaginal incision (episiotomy) or vaginal tear. °· You have pus draining from your episiotomy or vaginal tear. °· Your episiotomy or vaginal tear is separating. °· You have painful, hard, or reddened breasts. °· You have a severe headache. °· You have blurred vision or see spots. °· You feel sad or depressed. °· You have thoughts of hurting yourself or your newborn. °· You have questions about your care, the care of your newborn, or medicines. °· You are dizzy or lightheaded. °· You have a rash. °· You have nausea or vomiting. °· You were breastfeeding and have not had a menstrual period within 12 weeks after you stopped breastfeeding. °· You are not breastfeeding and have not had a menstrual period by the 12th week after delivery. °· You have a fever. °SEEK IMMEDIATE MEDICAL CARE IF:  °· You have persistent pain. °· You have chest pain. °· You have shortness of breath. °· You faint. °· You have leg pain. °· You have stomach pain. °· Your vaginal bleeding saturates two or more sanitary pads   in 1 hour. °MAKE SURE YOU:  °· Understand these instructions. °· Will watch your condition. °· Will get help right away if you are not doing well or get worse. °Document Released: 02/14/2000 Document Revised: 11/11/2011 Document Reviewed: 10/14/2011 °ExitCare® Patient Information ©2014 [employer]. ° °Postpartum Depression and Baby  Blues °The postpartum period begins right after the birth of a baby. During this time, there is often a great amount of joy and excitement. It is also a time of considerable changes in the life of the parent(s). Regardless of how many times a mother gives birth, each child brings new challenges and dynamics to the family. It is not unusual to have feelings of excitement accompanied by confusing shifts in moods, emotions, and thoughts. All mothers are at risk of developing postpartum depression or the "baby blues." These mood changes can occur right after giving birth, or they may occur many months after giving birth. The baby blues or postpartum depression can be mild or severe. Additionally, postpartum depression can resolve rather quickly, or it can be a long-term condition. °CAUSES °Elevated hormones and their rapid decline are thought to be a main cause of postpartum depression and the baby blues. There are a number of hormones that radically change during and after pregnancy. Estrogen and progesterone usually decrease immediately after delivering your baby. The level of thyroid hormone and various cortisol steroids also rapidly drop. Other factors that play a major role in these changes include major life events and genetics.  °RISK FACTORS °If you have any of the following risks for the baby blues or postpartum depression, know what symptoms to watch out for during the postpartum period. Risk factors that may increase the likelihood of getting the baby blues or postpartum depression include: °· Having a personal or family history of depression. °· Having depression while being pregnant. °· Having premenstrual or oral contraceptive-associated mood issues. °· Having exceptional life stress. °· Having marital conflict. °· Lacking a social support network. °· Having a baby with special needs. °· Having health problems such as diabetes. °SYMPTOMS °Baby blues symptoms include: °· Brief fluctuations in mood, such as  going from extreme happiness to sadness. °· Decreased concentration. °· Difficulty sleeping. °· Crying spells, tearfulness. °· Irritability. °· Anxiety. °Postpartum depression symptoms typically begin within the first month after giving birth. These symptoms include: °· Difficulty sleeping or excessive sleepiness. °· Marked weight loss. °· Agitation. °· Feelings of worthlessness. °· Lack of interest in activity or food. °Postpartum psychosis is a very concerning condition and can be dangerous. Fortunately, it is rare. Displaying any of the following symptoms is cause for immediate medical attention. Postpartum psychosis symptoms include: °· Hallucinations and delusions. °· Bizarre or disorganized behavior. °· Confusion or disorientation. °DIAGNOSIS  °A diagnosis is made by an evaluation of your symptoms. There are no medical or lab tests that lead to a diagnosis, but there are various questionnaires that a caregiver may use to identify those with the baby blues, postpartum depression, or psychosis. Often times, a screening tool called the [place] Postnatal Depression Scale is used to diagnose depression in the postpartum period.  °TREATMENT °The baby blues usually goes away on its own in 1 to 2 weeks. Social support is often all that is needed. You should be encouraged to get adequate sleep and rest. Occasionally, you may be given medicines to help you sleep.  °Postpartum depression requires treatment as it can last several months or longer if it is not treated. Treatment may include individual or   group therapy, medicine, or both to address any social, physiological, and psychological factors that may play a role in the depression. Regular exercise, a healthy diet, rest, and social support may also be strongly recommended.  °Postpartum psychosis is more serious and needs treatment right away. Hospitalization is often needed. °HOME CARE INSTRUCTIONS °· Get as much rest as you can. Nap when the baby  sleeps. °· Exercise regularly. Some women find yoga and walking to be beneficial. °· Eat a balanced and nourishing diet. °· Do little things that you enjoy. Have a cup of tea, take a bubble bath, read your favorite magazine, or listen to your favorite music. °· Avoid alcohol. °· Ask for help with household chores, cooking, grocery shopping, or running errands as needed. Do not try to do everything. °· Talk to people close to you about how you are feeling. Get support from your partner, family members, friends, or other new moms. °· Try to stay positive in how you think. Think about the things you are grateful for. °· Do not spend a lot of time alone. °· Only take medicine as directed by your caregiver. °· Keep all your postpartum appointments. °· Let your caregiver know if you have any concerns. °SEEK MEDICAL CARE IF: °You are having a reaction or problems with your medicine. °SEEK IMMEDIATE MEDICAL CARE IF: °· You have suicidal feelings. °· You feel you may harm the baby or someone else. °Document Released: 11/21/2003 Document Revised: 05/11/2011 Document Reviewed: 11/28/2012 °ExitCare® Patient Information ©2014 [employer]. ° °

## 2013-05-02 NOTE — Discharge Summary (Signed)
Vaginal Delivery Discharge Summary  Yvette Trevino  DOB:    November 03, 1990 MRN:    [id number] CSN:    [id number]  Date of admission:                  04/29/13  Date of discharge:                   05/02/13  Procedures this admission: SVD by S.Eilish Mcdaniel, CNM, epidural, unasyn IV for 24hrs after delivery   Date of Delivery: 04/30/13   Newborn Data:  Live born female  Birth Weight: 6 lb 14.8 oz (3140 g) APGAR: 3, 7  Home with mother.  Circumcision Plan: outpatient   History of Present Illness:  Yvette Trevino is a 23 y.o. female, G1P1001, who presents at [redacted]w[redacted]d weeks gestation. The patient has been followed at the [facility] division of [facility]. She was admitted rupture of membranes. Her pregnancy has been complicated by: none.  Hospital course:  The patient was admitted for SROM.    Her labor was complicated by variable decels. She proceeded to have a vaginal delivery of a healthy infant. Her delivery was not complicated. Her postpartum course was complicated by a PP fever. She rcv'd unasyn IV for 24hrs and remained afebrile the rest of her stay.  She was discharged to home on postpartum day 2 doing well.  Feeding:  breast  Contraception:  undecided, but likely planning depo-provera, will decide at [facility] visit  Discharge hemoglobin:  Hemoglobin  Date Value Ref Range Status  04/30/2013 12.8  12.0 - 15.0 g/dL Final     HCT  Date Value Ref Range Status  04/30/2013 36.2  36.0 - 46.0 % Final    Discharge Physical Exam:   General: alert and no distress Lochia: appropriate Uterine Fundus: firm Incision: healing well DVT Evaluation: No evidence of DVT seen on physical exam. Negative Homan's sign. No significant calf/ankle edema.  Intrapartum Procedures: spontaneous vaginal delivery and epidural, pitocin aug, IUPC, FSE, amnioinfusion  Postpartum Procedures: antibiotics Complications-Operative and Postpartum: 2nd degree perineal  laceration and PP fever  Discharge Diagnoses: Term Pregnancy-delivered  Discharge Information:  Activity:           pelvic rest Diet:                routine Medications: PNV Condition:      stable Instructions:   Postpartum Care After Vaginal Delivery  After you deliver your newborn (postpartum period), the usual stay in the hospital is 24 72 hours. If there were problems with your labor or delivery, or if you have other medical problems, you might be in the hospital longer.  While you are in the hospital, you will receive help and instructions on how to care for yourself and your newborn during the postpartum period.  While you are in the hospital:  Be sure to tell your nurses if you have pain or discomfort, as well as where you feel the pain and what makes the pain worse.  If you had an incision made near your vagina (episiotomy) or if you had some tearing during delivery, the nurses may put ice packs on your episiotomy or tear. The ice packs may help to reduce the pain and swelling.  If you are breastfeeding, you may feel uncomfortable contractions of your uterus for a couple of weeks. This is normal. The contractions help your uterus get back to normal size.  It is normal to have some  bleeding after delivery.  For the first 1 3 days after delivery, the flow is red and the amount may be similar to a period.  It is common for the flow to start and stop.  In the first few days, you may pass some small clots. Let your nurses know if you begin to pass large clots or your flow increases.  Do not  flush blood clots down the toilet before having the nurse look at them.  During the next 3 10 days after delivery, your flow should become more watery and pink or brown-tinged in color.  Ten to fourteen days after delivery, your flow should be a small amount of yellowish-white discharge.  The amount of your flow will decrease over the first few weeks after delivery. Your flow may stop in 6  8 weeks. Most women have had their flow stop by 12 weeks after delivery.  You should change your sanitary pads frequently.  Wash your hands thoroughly with soap and water for at least 20 seconds after changing pads, using the toilet, or before holding or feeding your newborn.  You should feel like you need to empty your bladder within the first 6 8 hours after delivery.  In case you become weak, lightheaded, or faint, call your nurse before you get out of bed for the first time and before you take a shower for the first time.  Within the first few days after delivery, your breasts may begin to feel tender and full. This is called engorgement. Breast tenderness usually goes away within 48 72 hours after engorgement occurs. You may also notice milk leaking from your breasts. If you are not breastfeeding, do not stimulate your breasts. Breast stimulation can make your breasts produce more milk.  Spending as much time as possible with your newborn is very important. During this time, you and your newborn can feel close and get to know each other. Having your newborn stay in your room (rooming in) will help to strengthen the bond with your newborn. It will give you time to get to know your newborn and become comfortable caring for your newborn.  Your hormones change after delivery. Sometimes the hormone changes can temporarily cause you to feel sad or tearful. These feelings should not last more than a few days. If these feelings last longer than that, you should talk to your caregiver.  If desired, talk to your caregiver about methods of family planning or contraception.  Talk to your caregiver about immunizations. Your caregiver may want you to have the following immunizations before leaving the hospital:  Tetanus, diphtheria, and pertussis (Tdap) or tetanus and diphtheria (Td) immunization. It is very important that you and your family (including grandparents) or others caring for your newborn are  up-to-date with the Tdap or Td immunizations. The Tdap or Td immunization can help protect your newborn from getting ill.  Rubella immunization.  Varicella (chickenpox) immunization.  Influenza immunization. You should receive this annual immunization if you did not receive the immunization during your pregnancy. Document Released: 12/14/2006 Document Revised: 11/11/2011 Document Reviewed: 10/14/2011 [facility] Patient Information 2014 [place], [place].   Postpartum Depression and Baby Blues  The postpartum period begins right after the birth of a baby. During this time, there is often a great amount of joy and excitement. It is also a time of considerable changes in the life of the parent(s). Regardless of how many times a mother gives birth, each child brings new challenges and dynamics to the family.  It is not unusual to have feelings of excitement accompanied by confusing shifts in moods, emotions, and thoughts. All mothers are at risk of developing postpartum depression or the "baby blues." These mood changes can occur right after giving birth, or they may occur many months after giving birth. The baby blues or postpartum depression can be mild or severe. Additionally, postpartum depression can resolve rather quickly, or it can be a long-term condition. CAUSES Elevated hormones and their rapid decline are thought to be a main cause of postpartum depression and the baby blues. There are a number of hormones that radically change during and after pregnancy. Estrogen and progesterone usually decrease immediately after delivering your baby. The level of thyroid hormone and various cortisol steroids also rapidly drop. Other factors that play a major role in these changes include major life events and genetics.  RISK FACTORS If you have any of the following risks for the baby blues or postpartum depression, know what symptoms to watch out for during the postpartum period. Risk factors that may increase  the likelihood of getting the baby blues or postpartum depression include:  Havinga personal or family history of depression.  Having depression while being pregnant.  Having premenstrual or oral contraceptive-associated mood issues.  Having exceptional life stress.  Having marital conflict.  Lacking a social support network.  Having a baby with special needs.  Having health problems such as diabetes. SYMPTOMS Baby blues symptoms include:  Brief fluctuations in mood, such as going from extreme happiness to sadness.  Decreased concentration.  Difficulty sleeping.  Crying spells, tearfulness.  Irritability.  Anxiety. Postpartum depression symptoms typically begin within the first month after giving birth. These symptoms include:  Difficulty sleeping or excessive sleepiness.  Marked weight loss.  Agitation.  Feelings of worthlessness.  Lack of interest in activity or food. Postpartum psychosis is a very concerning condition and can be dangerous. Fortunately, it is rare. Displaying any of the following symptoms is cause for immediate medical attention. Postpartum psychosis symptoms include:  Hallucinations and delusions.  Bizarre or disorganized behavior.  Confusion or disorientation. DIAGNOSIS  A diagnosis is made by an evaluation of your symptoms. There are no medical or lab tests that lead to a diagnosis, but there are various questionnaires that a caregiver may use to identify those with the baby blues, postpartum depression, or psychosis. Often times, a screening tool called the [place] Postnatal Depression Scale is used to diagnose depression in the postpartum period.  TREATMENT The baby blues usually goes away on its own in 1 to 2 weeks. Social support is often all that is needed. You should be encouraged to get adequate sleep and rest. Occasionally, you may be given medicines to help you sleep.  Postpartum depression requires treatment as it can last  several months or longer if it is not treated. Treatment may include individual or group therapy, medicine, or both to address any social, physiological, and psychological factors that may play a role in the depression. Regular exercise, a healthy diet, rest, and social support may also be strongly recommended.  Postpartum psychosis is more serious and needs treatment right away. Hospitalization is often needed. HOME CARE INSTRUCTIONS  Get as much rest as you can. Nap when the baby sleeps.  Exercise regularly. Some women find yoga and walking to be beneficial.  Eat a balanced and nourishing diet.  Do little things that you enjoy. Have a cup of tea, take a bubble bath, read your favorite magazine, or listen to your  favorite music.  Avoid alcohol.  Ask for help with household chores, cooking, grocery shopping, or running errands as needed. Do not try to do everything.  Talk to people close to you about how you are feeling. Get support from your partner, family members, friends, or other new moms.  Try to stay positive in how you think. Think about the things you are grateful for.  Do not spend a lot of time alone.  Only take medicine as directed by your caregiver.  Keep all your postpartum appointments.  Let your caregiver know if you have any concerns. SEEK MEDICAL CARE IF: You are having a reaction or problems with your medicine. SEEK IMMEDIATE MEDICAL CARE IF:  You have suicidal feelings.  You feel you may harm the baby or someone else. Document Released: 11/21/2003 Document Revised: 05/11/2011 Document Reviewed: 12/23/2010 [facility] Patient Information 2014 [place], [place].   Discharge to: home  Follow-up Information   Follow up with [facility] In 6 weeks.   Specialty:  Obstetrics and Gynecology   Contact information:   [address] [place] [address] [phone]       Malissa Hippo 05/02/2013

## 2013-09-04 ENCOUNTER — Ambulatory Visit: Payer: BC Managed Care – PPO | Admitting: Family

## 2014-01-01 ENCOUNTER — Encounter (HOSPITAL_COMMUNITY): Payer: Self-pay

## 2015-09-18 LAB — OB RESULTS CONSOLE HEPATITIS B SURFACE ANTIGEN: HEP B S AG: NEGATIVE

## 2015-09-18 LAB — OB RESULTS CONSOLE ABO/RH: RH TYPE: POSITIVE

## 2015-09-18 LAB — OB RESULTS CONSOLE ANTIBODY SCREEN: ANTIBODY SCREEN: NEGATIVE

## 2015-09-18 LAB — OB RESULTS CONSOLE RPR: RPR: NONREACTIVE

## 2015-09-18 LAB — OB RESULTS CONSOLE GC/CHLAMYDIA
Chlamydia: NEGATIVE
Gonorrhea: NEGATIVE

## 2015-09-18 LAB — OB RESULTS CONSOLE RUBELLA ANTIBODY, IGM: RUBELLA: IMMUNE

## 2015-09-18 LAB — OB RESULTS CONSOLE HIV ANTIBODY (ROUTINE TESTING): HIV: NONREACTIVE

## 2015-12-26 ENCOUNTER — Inpatient Hospital Stay (HOSPITAL_COMMUNITY): Payer: 59

## 2015-12-26 ENCOUNTER — Encounter (HOSPITAL_COMMUNITY): Payer: Self-pay | Admitting: *Deleted

## 2015-12-26 ENCOUNTER — Inpatient Hospital Stay (HOSPITAL_COMMUNITY)
Admission: AD | Admit: 2015-12-26 | Discharge: 2015-12-26 | Disposition: A | Discharge status: Home / Self Care | Payer: 59 | Source: Ambulatory Visit | Attending: Obstetrics and Gynecology | Admitting: Obstetrics and Gynecology

## 2015-12-26 DIAGNOSIS — M25571 Pain in right ankle and joints of right foot: Secondary | ICD-10-CM | POA: Insufficient documentation

## 2015-12-26 DIAGNOSIS — O9A212 Injury, poisoning and certain other consequences of external causes complicating pregnancy, second trimester: Secondary | ICD-10-CM | POA: Diagnosis not present

## 2015-12-26 DIAGNOSIS — S93401A Sprain of unspecified ligament of right ankle, initial encounter: Secondary | ICD-10-CM | POA: Diagnosis not present

## 2015-12-26 DIAGNOSIS — W19XXXA Unspecified fall, initial encounter: Secondary | ICD-10-CM

## 2015-12-26 DIAGNOSIS — Z3A25 25 weeks gestation of pregnancy: Secondary | ICD-10-CM | POA: Diagnosis not present

## 2015-12-26 DIAGNOSIS — W1830XA Fall on same level, unspecified, initial encounter: Secondary | ICD-10-CM | POA: Insufficient documentation

## 2015-12-26 DIAGNOSIS — O26892 Other specified pregnancy related conditions, second trimester: Secondary | ICD-10-CM | POA: Diagnosis present

## 2015-12-26 DIAGNOSIS — R109 Unspecified abdominal pain: Secondary | ICD-10-CM | POA: Diagnosis not present

## 2015-12-26 DIAGNOSIS — Z8759 Personal history of other complications of pregnancy, childbirth and the puerperium: Secondary | ICD-10-CM

## 2015-12-26 LAB — PROTIME-INR
INR: 0.93
Prothrombin Time: 12.5 seconds (ref 11.4–15.2)

## 2015-12-26 LAB — FIBRINOGEN: Fibrinogen: 418 mg/dL (ref 210–475)

## 2015-12-26 LAB — APTT: aPTT: 28 seconds (ref 24–36)

## 2015-12-26 NOTE — MAU Note (Signed)
Urine sent to lab 

## 2015-12-26 NOTE — MAU Provider Note (Signed)
History     CSN: [id number]  Arrival date and time: 12/26/15 1456   First Provider Initiated Contact with Patient 12/26/15 1610      Chief Complaint  Patient presents with  . Fall   HPI Yvette Trevino is a 25 y.o. G2P1001 at 5374w1d who presents s/p fall with abdominal & ankle pain. Fall occurred at 130 pm today. States she was getting out of her car, landed awkwardly on her right foot and fell backwards onto her back. Since then reports right ankle pain that is constant & throbbing. Rates pain 6/10; has not treated. States she can not bear weight on her right foot and can't move her right ankle. Also reports intermittent abdominal pain. Rates abdominal pain 3/10 & describes as cramp like. Pt has history of placenta previa with this pregnancy. Denies vaginal bleeding, LOF, or LOF. Positive fetal movement.   OB History    Gravida Para Term Preterm AB Living   2 1 1     1    SAB TAB Ectopic Multiple Live Births           1      Past Medical History:  Diagnosis Date  . UTI (urinary tract infection)   . Vitamin D deficiency     Past Surgical History:  Procedure Laterality Date  . NO PAST SURGERIES      Family History  Problem Relation Age of Onset  . Breast cancer Maternal Grandmother   . Cancer Maternal Grandmother     breast    Social History  Substance Use Topics  . Smoking status: Never Smoker  . Smokeless tobacco: Never Used  . Alcohol use 0.5 oz/week    1 Standard drinks or equivalent per week    Allergies: No Known Allergies  Prescriptions Prior to Admission  Medication Sig Dispense Refill Last Dose  . Prenatal Vit-Fe Fumarate-FA (PRENATAL MULTIVITAMIN) TABS tablet Take 1 tablet by mouth daily at 12 noon.   Past Week at Unknown time    Review of Systems  Constitutional: Negative.   Gastrointestinal: Positive for abdominal pain.  Genitourinary: Negative.   Musculoskeletal: Positive for falls and joint pain (right ankle).  Neurological: Negative for  dizziness and loss of consciousness.   Physical Exam   Blood pressure 114/67, pulse 104, temperature 98.4 F (36.9 C), temperature source Oral, resp. rate 18, unknown if currently breastfeeding.  Physical Exam  Nursing note and vitals reviewed. Constitutional: She is oriented to person, place, and time. She appears well-developed and well-nourished. No distress.  HENT:  Head: Normocephalic and atraumatic.  Eyes: Conjunctivae are normal. Right eye exhibits no discharge. Left eye exhibits no discharge. No scleral icterus.  Neck: Normal range of motion.  Cardiovascular: Normal rate.   Respiratory: Effort normal. No respiratory distress.  GI: Soft. There is no tenderness.  Musculoskeletal:       Right ankle: She exhibits decreased range of motion and swelling. She exhibits no deformity and normal pulse. Tenderness. Lateral malleolus tenderness found.  Neurological: She is alert and oriented to person, place, and time.  Skin: Skin is warm and dry. She is not diaphoretic.  Psychiatric: She has a normal mood and affect. Her behavior is normal. Judgment and thought content normal.   Fetal Tracing:  Baseline: 140 Variability: moderate Accelerations: none Decelerations: none  Toco: none  MAU Course  Procedures Results for orders placed or performed during the hospital encounter of 12/26/15 (from the past 24 hour(s))  APTT     Status: None  Collection Time: 12/26/15  5:05 PM  Result Value Ref Range   aPTT 28 24 - 36 seconds  Protime-INR     Status: None   Collection Time: 12/26/15  5:05 PM  Result Value Ref Range   Prothrombin Time 12.5 11.4 - 15.2 seconds   INR 0.93   Fibrinogen     Status: None   Collection Time: 12/26/15  5:05 PM  Result Value Ref Range   Fibrinogen 418 210 - 475 mg/dL   Dg Ankle 2 Views Right  Result Date: 12/26/2015 CLINICAL DATA:  Fall today with right ankle injury EXAM: RIGHT ANKLE - 2 VIEW COMPARISON:  None. FINDINGS: There is no evidence of  fracture, dislocation, or joint effusion. There is no evidence of arthropathy or other focal bone abnormality. Soft tissues are unremarkable. IMPRESSION: Negative. Electronically Signed   By: Delbert Phenix M.D.   On: 12/26/2015 17:10   Dg Foot Complete Right  Result Date: 12/26/2015 CLINICAL DATA:  Fall with right ankle injury today EXAM: RIGHT FOOT COMPLETE - 3+ VIEW COMPARISON:  07/30/2007 right foot radiographs FINDINGS: There is no evidence of fracture or dislocation. There is no evidence of arthropathy or other focal bone abnormality. Soft tissues are unremarkable. IMPRESSION: Negative. Electronically Signed   By: Delbert Phenix M.D.   On: 12/26/2015 17:09    MDM Category 1 fetal tracing; no contractions appreciated on monitor Xray of right foot & ankle --- negative for fracture Ultrasound -- previa resolved, no evidence of abruption S/w Dr. Mindi Slicker regarding pt; will come see pt & offer reassurances; otherwise pt ok for discharge home  Assessment and Plan  A: 1. Sprain of right ankle, unspecified ligament, initial encounter   2. Fall   3. [redacted] weeks gestation of pregnancy   4. History of placenta previa    P: Discharge home RICE for right LE Discussed reasons to return to MAU Keep f/u with OB  Judeth Horn 12/26/2015, 4:10 PM

## 2015-12-26 NOTE — MAU Note (Signed)
C/o fall this afternoon around @ 1330; landed on her back and c/o R ankle pain;

## 2015-12-26 NOTE — Discharge Instructions (Signed)
Ankle Sprain °An ankle sprain is an injury to the strong, fibrous tissues (ligaments) that hold the bones of your ankle joint together.  °CAUSES °An ankle sprain is usually caused by a fall or by twisting your ankle. Ankle sprains most commonly occur when you step on the outer edge of your foot, and your ankle turns inward. People who participate in sports are more prone to these types of injuries.  °SYMPTOMS  °· Pain in your ankle. The pain may be present at rest or only when you are trying to stand or walk. °· Swelling. °· Bruising. Bruising may develop immediately or within 1 to 2 days after your injury. °· Difficulty standing or walking, particularly when turning corners or changing directions. °DIAGNOSIS  °Your caregiver will ask you details about your injury and perform a physical exam of your ankle to determine if you have an ankle sprain. During the physical exam, your caregiver will press on and apply pressure to specific areas of your foot and ankle. Your caregiver will try to move your ankle in certain ways. An X-ray exam may be done to be sure a bone was not broken or a ligament did not separate from one of the bones in your ankle (avulsion fracture).  °TREATMENT  °Certain types of braces can help stabilize your ankle. Your caregiver can make a recommendation for this. Your caregiver may recommend the use of medicine for pain. If your sprain is severe, your caregiver may refer you to a surgeon who helps to restore function to parts of your skeletal system (orthopedist) or a physical therapist. °HOME CARE INSTRUCTIONS  °· Apply ice to your injury for 1-2 days or as directed by your caregiver. Applying ice helps to reduce inflammation and pain. °¨ Put ice in a plastic bag. °¨ Place a towel between your skin and the bag. °¨ Leave the ice on for 15-20 minutes at a time, every 2 hours while you are awake. °· Only take over-the-counter or prescription medicines for pain, discomfort, or fever as directed by  your caregiver. °· Elevate your injured ankle above the level of your heart as much as possible for 2-3 days. °· If your caregiver recommends crutches, use them as instructed. Gradually put weight on the affected ankle. Continue to use crutches or a cane until you can walk without feeling pain in your ankle. °· If you have a plaster splint, wear the splint as directed by your caregiver. Do not rest it on anything harder than a pillow for the first 24 hours. Do not put weight on it. Do not get it wet. You may take it off to take a shower or bath. °· You may have been given an elastic bandage to wear around your ankle to provide support. If the elastic bandage is too tight (you have numbness or tingling in your foot or your foot becomes cold and blue), adjust the bandage to make it comfortable. °· If you have an air splint, you may blow more air into it or let air out to make it more comfortable. You may take your splint off at night and before taking a shower or bath. Wiggle your toes in the splint several times per day to decrease swelling. °SEEK MEDICAL CARE IF:  °· You have rapidly increasing bruising or swelling. °· Your toes feel extremely cold or you lose feeling in your foot. °· Your pain is not relieved with medicine. °SEEK IMMEDIATE MEDICAL CARE IF: °· Your toes are numb or blue. °·   You have severe pain that is increasing. MAKE SURE YOU:   Understand these instructions.  Will watch your condition.  Will get help right away if you are not doing well or get worse.   This information is not intended to replace advice given to you by your health care provider. Make sure you discuss any questions you have with your health care provider.   Document Released: 02/16/2005 Document Revised: 03/09/2014 Document Reviewed: 02/28/2011 Elsevier Interactive Patient Education 2016 [employer] Inc[employer]   What Do I Need to Know About Injuries During Pregnancy? Trauma is the most common cause of injury and death in  pregnant women. This can also result in significant harm or death of the baby. Your baby is protected in the womb (uterus) by a sac filled with fluid (amniotic sac). Your baby can be harmed if there is direct, high-impact trauma to your abdomen and pelvis. This type of trauma can result in tearing of your uterus, the placenta pulling away from the wall of the uterus (placenta abruption), or the amniotic sac breaking open (rupture of membranes). These injuries can decrease or stop the blood supply to your baby or cause you to go into labor earlier than expected. Minor falls and low-impact automobile accidents do not usually harm your baby, even if they do minimally harm you. WHAT KIND OF INJURIES CAN AFFECT MY PREGNANCY? The most common causes of injury or death to a baby include:  Falls. Falls are more common in the second and third trimester of the pregnancy. Factors that increase your risk of falling include:  Increase in your weight.  The change in your center of gravity.  Tripping over an object that cannot be seen.  Increased looseness (laxity) of your ligaments resulting in less coordinated movements (you may feel clumsy).  Falling during high-risk activities like horseback riding or skiing.  Automobile accidents. It is important to wear your seat belt properly, with the lap belt below your abdomen, and always practice safe driving.  Domestic violence or assault.  Burns (Metallurgistfire or electrical). The most common causes of injury or death to the pregnant woman include:  Injuries that cause severe bleeding, shock, and loss of blood flow to major organs.  Head and neck injuries that result in severe brain or spinal damage.  Chest trauma that can cause direct injury to the heart and lungs or any injury that affects the area enclosed by the ribs. Trauma to this area can result in cardiorespiratory arrest. WHAT CAN I DO TO PROTECT MYSELF AND MY BABY FROM INJURY WHILE I AM PREGNANT?  Remove  slippery rugs and loose objects on the floor that increase your risk of tripping.  Avoid walking on wet or slippery floors.  Wear comfortable shoes that have a good grip on the sole. Do not wear high-heeled shoes.  Always wear your seat belt properly, with the lap belt below your abdomen, and always practice safe driving. Do not ride on a motorcycle while pregnant.  Do not participate in high-impact activities or sports.  Avoid fires, starting fires, lifting heavy pots of boiling or hot liquids, and fixing electrical problems.  Only take over-the-counter or prescription medicines for pain, fever, or discomfort as directed by your health care provider.  Know your blood type and the father's blood type in case you develop vaginal bleeding or experience an injury for which a blood transfusion may be necessary.  Call your local emergency services (911 in the [place] if you are a victim of  domestic violence or assault. Spousal abuse can be a significant cause of trauma during pregnancy. For help and support, contact the [employer]. WHEN SHOULD I SEEK IMMEDIATE MEDICAL CARE?   You fall on your abdomen or experience any high-force accident or injury.  You have been assaulted (domestic or otherwise).  You have been in a car accident.  You develop vaginal bleeding.  You develop fluid leaking from the vagina.  You develop uterine contractions (pelvic cramping, pain, or significant low back pain).  You become weak or faint, or have uncontrolled vomiting after trauma.  You had a serious burn. This includes burns to the face, neck, hands, or genitals, or burns greater than the size of your palm anywhere else.  You develop neck stiffness or pain after a fall or from other trauma.  You develop a headache or vision problems after a fall or from other trauma.  You do not feel the baby moving or the baby is not moving as much as before a fall or other trauma.   This  information is not intended to replace advice given to you by your health care provider. Make sure you discuss any questions you have with your health care provider.   Document Released: 03/26/2004 Document Revised: 03/09/2014 Document Reviewed: 11/23/2012 Elsevier Interactive Patient Education [employer]

## 2015-12-26 NOTE — MAU Provider Note (Signed)
Pt is a G2P1001 female at 2025 1/[redacted]wks gestation who reports a fall while getting out of her car at 1330 this afternoon. She twisted her foot and landed on her back. Pt with noted previa this pregnancy. She presented to MAU for assessment. She denied any VB or cramping but was sore and ankle hurt to put weight on it Pts' foot was cleared by xray  US done noted no abruption, resolved previa Abruption labs wnl NST/TOCO - cat 1; no contractions  Pt reassured and discharged to home at approx 5 hours after incident occurred.  Keep regular scheduled appts; call if any new problems occur Of note pt is enquiring about possibility of elective c/s with this pregnancy

## 2016-03-02 NOTE — L&D Delivery Note (Signed)
Delivery Note At 10:06 AM a viable female was delivered via Vaginal, Spontaneous Delivery (Presentation: ;  LOA).  APGAR: 8, 9; weight pending  .   Placenta status: delivered spontaneously.  Cord:  with the following complications: nuchal x 1 delivered through.  Mild uterine atony noted after delivery, repsponded to massage, pitocin and methergine x 1.  Anesthesia:  epidural Episiotomy: None Lacerations: None Suture Repair: n/a Est. Blood Loss (mL): 240ml  Mom to postpartum.  Baby to Couplet care / Skin to Skin.  [name],Yvette Trevino 04/14/2016, 10:23 AM

## 2016-03-11 LAB — OB RESULTS CONSOLE GBS: GBS: NEGATIVE

## 2016-04-01 ENCOUNTER — Telehealth (HOSPITAL_COMMUNITY): Payer: Self-pay | Admitting: *Deleted

## 2016-04-01 NOTE — Telephone Encounter (Signed)
Preadmission screen  

## 2016-04-03 ENCOUNTER — Encounter (HOSPITAL_COMMUNITY): Payer: Self-pay | Admitting: *Deleted

## 2016-04-13 ENCOUNTER — Inpatient Hospital Stay (HOSPITAL_COMMUNITY)
Admission: AD | Admit: 2016-04-13 | Discharge: 2016-04-15 | DRG: 775 | Disposition: A | Discharge status: Home / Self Care | Payer: 59 | Source: Ambulatory Visit | Attending: Obstetrics and Gynecology | Admitting: Obstetrics and Gynecology

## 2016-04-13 ENCOUNTER — Encounter (HOSPITAL_COMMUNITY): Payer: Self-pay | Admitting: *Deleted

## 2016-04-13 DIAGNOSIS — Z3490 Encounter for supervision of normal pregnancy, unspecified, unspecified trimester: Secondary | ICD-10-CM

## 2016-04-13 DIAGNOSIS — O4292 Full-term premature rupture of membranes, unspecified as to length of time between rupture and onset of labor: Principal | ICD-10-CM | POA: Diagnosis present

## 2016-04-13 DIAGNOSIS — O9962 Diseases of the digestive system complicating childbirth: Secondary | ICD-10-CM | POA: Diagnosis present

## 2016-04-13 DIAGNOSIS — Z3A40 40 weeks gestation of pregnancy: Secondary | ICD-10-CM | POA: Diagnosis not present

## 2016-04-13 DIAGNOSIS — K219 Gastro-esophageal reflux disease without esophagitis: Secondary | ICD-10-CM | POA: Diagnosis present

## 2016-04-13 LAB — POCT FERN TEST: POCT FERN TEST: POSITIVE

## 2016-04-13 LAB — CBC
HEMATOCRIT: 37 % (ref 36.0–46.0)
Hemoglobin: 13.1 g/dL (ref 12.0–15.0)
MCH: 31.7 pg (ref 26.0–34.0)
MCHC: 35.4 g/dL (ref 30.0–36.0)
MCV: 89.6 fL (ref 78.0–100.0)
Platelets: 187 10*3/uL (ref 150–400)
RBC: 4.13 MIL/uL (ref 3.87–5.11)
RDW: 13.8 % (ref 11.5–15.5)
WBC: 10.8 10*3/uL — ABNORMAL HIGH (ref 4.0–10.5)

## 2016-04-13 LAB — TYPE AND SCREEN
ABO/RH(D): O POS
Antibody Screen: NEGATIVE

## 2016-04-13 MED ORDER — OXYTOCIN BOLUS FROM INFUSION
500.0000 mL | Freq: Once | INTRAVENOUS | Status: AC
  Administered 2016-04-14: 500 mL via INTRAVENOUS

## 2016-04-13 MED ORDER — OXYTOCIN 40 UNITS IN LACTATED RINGERS INFUSION - SIMPLE MED
2.5000 [IU]/h | INTRAVENOUS | Status: DC
  Administered 2016-04-14: 2.5 [IU]/h via INTRAVENOUS

## 2016-04-13 MED ORDER — TERBUTALINE SULFATE 1 MG/ML IJ SOLN
0.2500 mg | Freq: Once | INTRAMUSCULAR | Status: DC | PRN
  Filled 2016-04-13: qty 1

## 2016-04-13 MED ORDER — OXYCODONE-ACETAMINOPHEN 5-325 MG PO TABS: 1.0000 | ORAL_TABLET | ORAL | Status: DC | PRN

## 2016-04-13 MED ORDER — LACTATED RINGERS IV SOLN
INTRAVENOUS | Status: DC
  Administered 2016-04-13: 23:00:00 via INTRAVENOUS

## 2016-04-13 MED ORDER — LIDOCAINE HCL (PF) 1 % IJ SOLN
30.0000 mL | INTRAMUSCULAR | Status: DC | PRN
  Filled 2016-04-13: qty 30

## 2016-04-13 MED ORDER — OXYCODONE-ACETAMINOPHEN 5-325 MG PO TABS: 2.0000 | ORAL_TABLET | ORAL | Status: DC | PRN

## 2016-04-13 MED ORDER — BUTORPHANOL TARTRATE 1 MG/ML IJ SOLN: 1.0000 mg | INTRAMUSCULAR | Status: DC | PRN

## 2016-04-13 MED ORDER — ONDANSETRON HCL 4 MG/2ML IJ SOLN: 4.0000 mg | Freq: Four times a day (QID) | INTRAMUSCULAR | Status: DC | PRN

## 2016-04-13 MED ORDER — SOD CITRATE-CITRIC ACID 500-334 MG/5ML PO SOLN: 30.0000 mL | ORAL | Status: DC | PRN

## 2016-04-13 MED ORDER — LACTATED RINGERS IV SOLN: 500.0000 mL | INTRAVENOUS | Status: DC | PRN

## 2016-04-13 MED ORDER — OXYTOCIN 40 UNITS IN LACTATED RINGERS INFUSION - SIMPLE MED
1.0000 m[IU]/min | INTRAVENOUS | Status: DC
  Administered 2016-04-13: 2 m[IU]/min via INTRAVENOUS
  Filled 2016-04-13: qty 1000

## 2016-04-13 MED ORDER — ACETAMINOPHEN 325 MG PO TABS: 650.0000 mg | ORAL_TABLET | ORAL | Status: DC | PRN

## 2016-04-13 NOTE — MAU Note (Signed)
Urine sent to lab 

## 2016-04-13 NOTE — H&P (Signed)
Yvette Trevino is a 26 y.o. female presenting for complaint of leakage of fluid. Pt confirmed SROM in MAU by ferning.She is also noted to be 3cm dilated ( was 1cm in office at last check).  Pt is dated by an 11week [place]. Her prenatal care was complicated by placenta previa with resolved by 25weeks per [place] in MAU. The rest of her pregnancy was benign. She is GBS negative.  OB History    Gravida Para Term Preterm AB Living   2 1 1     1    SAB TAB Ectopic Multiple Live Births           1     Past Medical History:  Diagnosis Date  . UTI (urinary tract infection)   . Vitamin D deficiency    Past Surgical History:  Procedure Laterality Date  . NO PAST SURGERIES     Family History: family history includes Breast cancer in her maternal grandmother; Cancer in her maternal grandmother. Social History:  reports that she has never smoked. She has never used smokeless tobacco. She reports that she drinks about 0.5 oz of alcohol per week . She reports that she does not use drugs.     Maternal Diabetes: No Genetic Screening: Declined; carrier screening neg Maternal Ultrasounds/Referrals: Normal Fetal Ultrasounds or other Referrals:  None Maternal Substance Abuse:  No Significant Maternal Medications:  None Significant Maternal Lab Results:  Lab values include: Group B Strep negative Other Comments:  None  Review of Systems  Constitutional: Negative for chills, fever, malaise/fatigue and weight loss.  Eyes: Negative for blurred vision.  Respiratory: Negative for shortness of breath.   Cardiovascular: Negative for chest pain.  Gastrointestinal: Negative for abdominal pain, heartburn, nausea and vomiting.  Genitourinary: Negative for dysuria.  Musculoskeletal: Negative for back pain and myalgias.  Skin: Negative for itching and rash.  Neurological: Negative for dizziness and headaches.  Endo/Heme/Allergies: Does not bruise/bleed easily.  Psychiatric/Behavioral: Negative for depression,  hallucinations, substance abuse and suicidal ideas. The patient is nervous/anxious.    Maternal Medical History:  Reason for admission: Rupture of membranes.  Nausea.  Contractions: Frequency: rare.   Perceived severity is mild.    Fetal activity: Perceived fetal activity is normal.   Last perceived fetal movement was within the past hour.    Prenatal complications: Bleeding and placental abnormality (previa resolved).   Prenatal Complications - Diabetes: none.      Blood pressure 122/83, pulse 95, temperature 97.8 F (36.6 C), temperature source Oral, resp. rate 16, unknown if currently breastfeeding. Maternal Exam:  Uterine Assessment: Contraction strength is mild.  Contraction frequency is rare.   Abdomen: Patient reports generalized tenderness.  Estimated fetal weight is AGA.   Fetal presentation: vertex  Introitus: Normal vulva. Normal vagina.  Ferning test: positive.  Amniotic fluid character: clear.  Pelvis: adequate for delivery.   Cervix: Cervix evaluated by digital exam.     Physical Exam  Constitutional: She is oriented to person, place, and time. She appears well-developed and well-nourished.  HENT:  Head: Normocephalic.  Neck: Normal range of motion.  Cardiovascular: Normal rate.   Respiratory: Effort normal.  GI: Soft. There is generalized tenderness.  Genitourinary: Vagina normal and uterus normal.  Musculoskeletal: Normal range of motion.  Neurological: She is alert and oriented to person, place, and time.  Skin: Skin is warm.  Psychiatric: She has a normal mood and affect. Her behavior is normal. Judgment and thought content normal.    Prenatal labs: ABO, Rh: O/Positive/-- (  07/19 0000) Antibody: Negative (07/19 0000) Rubella: Immune (07/19 0000) RPR: Nonreactive (07/19 0000)  HBsAg: Negative (07/19 0000)  HIV: Non-reactive (07/19 0000)  GBS: Negative (01/10 0000)   Assessment/Plan: G2P1001 at 40 5/[redacted]wks gestation with SROM  Admit for labor  augmentation - start pitocin Pain control prn Anticipate svd   Cecilia Worema Banga 04/13/2016, 10:13 PM

## 2016-04-13 NOTE — MAU Note (Signed)
Pt states her water broke around 1930 clear fluid, moderate amount of fluid.  Denies VB, no pain.

## 2016-04-14 ENCOUNTER — Inpatient Hospital Stay (HOSPITAL_COMMUNITY): Payer: 59 | Admitting: Anesthesiology

## 2016-04-14 ENCOUNTER — Encounter (HOSPITAL_COMMUNITY): Payer: Self-pay | Admitting: *Deleted

## 2016-04-14 MED ORDER — BENZOCAINE-MENTHOL 20-0.5 % EX AERO
1.0000 "application " | INHALATION_SPRAY | CUTANEOUS | Status: DC | PRN
  Filled 2016-04-14: qty 56

## 2016-04-14 MED ORDER — SENNOSIDES-DOCUSATE SODIUM 8.6-50 MG PO TABS
2.0000 | ORAL_TABLET | ORAL | Status: DC
  Administered 2016-04-14: 2 via ORAL
  Filled 2016-04-14: qty 2

## 2016-04-14 MED ORDER — ONDANSETRON HCL 4 MG PO TABS
4.0000 mg | ORAL_TABLET | ORAL | Status: DC | PRN
  Administered 2016-04-14: 4 mg via ORAL
  Filled 2016-04-14: qty 1

## 2016-04-14 MED ORDER — DIPHENHYDRAMINE HCL 25 MG PO CAPS: 25.0000 mg | ORAL_CAPSULE | Freq: Four times a day (QID) | ORAL | Status: DC | PRN

## 2016-04-14 MED ORDER — EPHEDRINE 5 MG/ML INJ
10.0000 mg | INTRAVENOUS | Status: DC | PRN
  Filled 2016-04-14: qty 4

## 2016-04-14 MED ORDER — ZOLPIDEM TARTRATE 5 MG PO TABS: 5.0000 mg | ORAL_TABLET | Freq: Every evening | ORAL | Status: DC | PRN

## 2016-04-14 MED ORDER — DIBUCAINE 1 % RE OINT: 1.0000 "application " | TOPICAL_OINTMENT | RECTAL | Status: DC | PRN

## 2016-04-14 MED ORDER — WITCH HAZEL-GLYCERIN EX PADS: 1.0000 "application " | MEDICATED_PAD | CUTANEOUS | Status: DC | PRN

## 2016-04-14 MED ORDER — METHYLERGONOVINE MALEATE 0.2 MG/ML IJ SOLN
INTRAMUSCULAR | Status: AC
  Administered 2016-04-14: 0.2 mg via INTRAMUSCULAR
  Filled 2016-04-14: qty 1

## 2016-04-14 MED ORDER — SIMETHICONE 80 MG PO CHEW: 80.0000 mg | CHEWABLE_TABLET | ORAL | Status: DC | PRN

## 2016-04-14 MED ORDER — DIPHENHYDRAMINE HCL 50 MG/ML IJ SOLN: 12.5000 mg | INTRAMUSCULAR | Status: DC | PRN

## 2016-04-14 MED ORDER — PHENYLEPHRINE 40 MCG/ML (10ML) SYRINGE FOR IV PUSH (FOR BLOOD PRESSURE SUPPORT)
80.0000 ug | PREFILLED_SYRINGE | INTRAVENOUS | Status: DC | PRN
  Filled 2016-04-14: qty 5
  Filled 2016-04-14: qty 10

## 2016-04-14 MED ORDER — IBUPROFEN 600 MG PO TABS
600.0000 mg | ORAL_TABLET | Freq: Four times a day (QID) | ORAL | Status: DC
  Administered 2016-04-14 – 2016-04-15 (×3): 600 mg via ORAL
  Filled 2016-04-14 (×3): qty 1

## 2016-04-14 MED ORDER — ACETAMINOPHEN 325 MG PO TABS
650.0000 mg | ORAL_TABLET | ORAL | Status: DC | PRN
  Administered 2016-04-15: 650 mg via ORAL
  Filled 2016-04-14: qty 2

## 2016-04-14 MED ORDER — PHENYLEPHRINE 40 MCG/ML (10ML) SYRINGE FOR IV PUSH (FOR BLOOD PRESSURE SUPPORT)
80.0000 ug | PREFILLED_SYRINGE | INTRAVENOUS | Status: DC | PRN
  Filled 2016-04-14: qty 5

## 2016-04-14 MED ORDER — FENTANYL 2.5 MCG/ML BUPIVACAINE 1/10 % EPIDURAL INFUSION (WH - ANES): 14.0000 mL/h | INTRAMUSCULAR | Status: DC | PRN

## 2016-04-14 MED ORDER — TETANUS-DIPHTH-ACELL PERTUSSIS 5-2.5-18.5 LF-MCG/0.5 IM SUSP: 0.5000 mL | Freq: Once | INTRAMUSCULAR | Status: DC

## 2016-04-14 MED ORDER — ONDANSETRON HCL 4 MG/2ML IJ SOLN: 4.0000 mg | INTRAMUSCULAR | Status: DC | PRN

## 2016-04-14 MED ORDER — PRENATAL MULTIVITAMIN CH: 1.0000 | ORAL_TABLET | Freq: Every day | ORAL | Status: DC

## 2016-04-14 MED ORDER — FENTANYL 2.5 MCG/ML BUPIVACAINE 1/10 % EPIDURAL INFUSION (WH - ANES)
14.0000 mL/h | INTRAMUSCULAR | Status: DC | PRN
  Administered 2016-04-14 (×2): 14 mL/h via EPIDURAL
  Filled 2016-04-14: qty 100

## 2016-04-14 MED ORDER — LIDOCAINE HCL (PF) 1 % IJ SOLN
INTRAMUSCULAR | Status: DC | PRN
  Administered 2016-04-14 (×2): 4 mL

## 2016-04-14 MED ORDER — COCONUT OIL OIL: 1.0000 "application " | TOPICAL_OIL | Status: DC | PRN

## 2016-04-14 MED ORDER — LACTATED RINGERS IV SOLN: 500.0000 mL | Freq: Once | INTRAVENOUS | Status: DC

## 2016-04-14 NOTE — Anesthesia Pain Management Evaluation Note (Signed)
  CRNA Pain Management Visit Note  Patient: Yvette BoydenLesley Muscatello, 26 y.o., female  "Hello I am a member of the anesthesia team at [facility]. We have an anesthesia team available at all times to provide care throughout the hospital, including epidural management and anesthesia for C-section. I don't know your plan for the delivery whether it a natural birth, water birth, IV sedation, nitrous supplementation, doula or epidural, but we want to meet your pain goals."   1.Was your pain managed to your expectations on prior hospitalizations?   Yes   2.What is your expectation for pain management during this hospitalization?     Epidural  3.How can we help you reach that goal? Epidural in situ.  Record the patient's initial score and the patient's pain goal.   Pain: 0  Pain Goal: 7 [facility] wants you to be able to say your pain was always managed very well.  Lorie Melichar L 04/14/2016

## 2016-04-14 NOTE — Progress Notes (Signed)
Patient ID: Eustaquio BoydenLesley Trevino, female   DOB: 06-21-1990, 26 y.o.   MRN: [id number] Pt very comfortable with epidural afeb vss FHR category 1 with mild early decel's with contractions  Cervix 90/6-7/-1 prob OP  IUPC placed, contractions a bit spaced out Pitocin at 16 mu Placed in exaggerated Sims on peanut In active labor now

## 2016-04-14 NOTE — Lactation Note (Signed)
This note was copied from a baby's chart. Lactation Consultation Note  Patient Name: Girl Eustaquio BoydenLesley Beber UEAVW'UToday's Date: 04/14/2016 Reason for consult: Initial assessment Breastfeeding consultation services and support information given to patient.  This is mom's second baby and newborn is 347 hours old.  Baby has been to the breast 3 times since birth.  Mom states feedings have gone well and latch easy.  Mom states she knows how to hand express.  Instructed to feed baby with any feeding cue and call for assist/concerns prn.  Maternal Data Has patient been taught Hand Expression?: Yes Does the patient have breastfeeding experience prior to this delivery?: Yes  Feeding Feeding Type: Breast Fed Length of feed: 35 min  LATCH Score/Interventions                      Lactation Tools Discussed/Used     Consult Status Consult Status: Follow-up Date: 04/15/16 Follow-up type: In-patient    Huston FoleyMOULDEN, Ashely Joshua S 04/14/2016, 5:31 PM

## 2016-04-14 NOTE — Anesthesia Postprocedure Evaluation (Signed)
Anesthesia Post Note  Patient: Yvette Trevino  Procedure(s) Performed: * No procedures listed *  Patient location during evaluation: Mother Baby Anesthesia Type: Epidural Level of consciousness: awake and alert and oriented Pain management: pain level controlled Vital Signs Assessment: post-procedure vital signs reviewed and stable Respiratory status: spontaneous breathing and nonlabored ventilation Cardiovascular status: stable Postop Assessment: no headache, no backache, epidural receding, no signs of nausea or vomiting, patient able to bend at knees and adequate PO intake Anesthetic complications: no        Last Vitals:  Vitals:   04/14/16 1223 04/14/16 1330  BP: 114/71 117/79  Pulse: 90 85  Resp: 18 18  Temp: 36.7 C 36.6 C    Last Pain:  Vitals:   04/14/16 1330  TempSrc: Oral  PainSc: 2    Pain Goal: Patients Stated Pain Goal: 6 (04/13/16 2249)               Laban EmperorMalinova,[place] Lapaglia Hristova

## 2016-04-14 NOTE — Anesthesia Procedure Notes (Signed)
Epidural Patient location during procedure: OB  Staffing Anesthesiologist: Lewie LoronGERMEROTH, Edna Rede Performed: anesthesiologist   Preanesthetic Checklist Completed: patient identified, pre-op evaluation, timeout performed, IV checked, risks and benefits discussed and monitors and equipment checked  Epidural Patient position: sitting Prep: site prepped and draped and DuraPrep Patient monitoring: heart rate, blood pressure and continuous pulse ox Approach: midline Location: L2-L3 Injection technique: LOR air and LOR saline  Needle:  Needle type: Tuohy  Needle gauge: 17 G Needle length: 9 cm Needle insertion depth: 4 cm Catheter type: closed end flexible Catheter size: 19 Gauge Catheter at skin depth: 10 cm Test dose: negative  Assessment Sensory level: T8 Events: blood not aspirated, injection not painful, no injection resistance, negative IV test and no paresthesia  Additional Notes Reason for block:procedure for pain

## 2016-04-14 NOTE — Anesthesia Preprocedure Evaluation (Signed)
Anesthesia Evaluation  Patient identified by MRN, date of birth, ID band Patient awake    Reviewed: Allergy & Precautions, H&P , Patient's Chart, lab work & pertinent test results  Airway Mallampati: III  TM Distance: >3 FB Neck ROM: Full    Dental no notable dental hx. (+) Teeth Intact   Pulmonary neg pulmonary ROS,    Pulmonary exam normal breath sounds clear to auscultation       Cardiovascular negative cardio ROS Normal cardiovascular exam Rhythm:Regular Rate:Normal     Neuro/Psych negative neurological ROS  negative psych ROS   GI/Hepatic Neg liver ROS, GERD  Medicated and Controlled,  Endo/Other  negative endocrine ROS  Renal/GU negative Renal ROS     Musculoskeletal negative musculoskeletal ROS (+)   Abdominal   Peds  Hematology negative hematology ROS (+)   Anesthesia Other Findings   Reproductive/Obstetrics (+) Pregnancy                             Anesthesia Physical  Anesthesia Plan  ASA: II  Anesthesia Plan: Epidural   Post-op Pain Management:    Induction:   Airway Management Planned:   Additional Equipment:   Intra-op Plan:   Post-operative Plan:   Informed Consent: I have reviewed the patients History and Physical, chart, labs and discussed the procedure including the risks, benefits and alternatives for the proposed anesthesia with the patient or authorized representative who has indicated his/her understanding and acceptance.     Plan Discussed with:   Anesthesia Plan Comments:         Anesthesia Quick Evaluation

## 2016-04-14 NOTE — Progress Notes (Signed)
Patient ID: Yvette Trevino, female   DOB: [birth year], 26 y.o.   MRN: [id number] Pt doing well. Rating pain with contractions at 7/10 - tolerable. Plans to get epidural later. No complaints VSS EFM - cat 1, 140s TOCO - ctxs q 1-703mins SVE - 4/75/-2  A/P: G2P1001 at 40 6/[redacted]wks gestation with SROM         Progressing on pit at 14mus         AROM of forebag done - clear fluid noted         Pain medication prn: iv or epidural         Anticipate svd

## 2016-04-15 ENCOUNTER — Inpatient Hospital Stay (HOSPITAL_COMMUNITY)
Admission: RE | Admit: 2016-04-15 | Discharge: 2016-04-15 | Disposition: A | Discharge status: Home / Self Care | Payer: 59 | Source: Ambulatory Visit | Attending: Obstetrics and Gynecology | Admitting: Obstetrics and Gynecology

## 2016-04-15 LAB — CBC
HCT: 28.8 % — ABNORMAL LOW (ref 36.0–46.0)
Hemoglobin: 10.2 g/dL — ABNORMAL LOW (ref 12.0–15.0)
MCH: 31.9 pg (ref 26.0–34.0)
MCHC: 35.1 g/dL (ref 30.0–36.0)
MCV: 90.9 fL (ref 78.0–100.0)
PLATELETS: 168 10*3/uL (ref 150–400)
RBC: 3.17 MIL/uL — AB (ref 3.87–5.11)
RDW: 14.1 % (ref 11.5–15.5)
WBC: 10.1 10*3/uL (ref 4.0–10.5)

## 2016-04-15 LAB — RPR: RPR Ser Ql: NONREACTIVE

## 2016-04-15 MED ORDER — IBUPROFEN 600 MG PO TABS: 600.0000 mg | ORAL_TABLET | Freq: Four times a day (QID) | ORAL | 0 refills | Status: DC

## 2016-04-15 NOTE — Discharge Summary (Signed)
OB Discharge Summary     Patient Name: Yvette BoydenLesley Weissmann DOB: 17-Jul-1990 MRN: [id number]  Date of admission: 04/13/2016 Delivering MD: Huel CoteICHARDSON, KATHY   Date of discharge: 04/15/2016  Admitting diagnosis: 40w water broke Intrauterine pregnancy: 6322w6d     Secondary diagnosis:  Active Problems:   Term pregnancy   NSVD (normal spontaneous vaginal delivery)      Discharge diagnosis: Term Pregnancy Delivered                                   Hospital course:  Onset of Labor With Vaginal Delivery     26 y.o. yo [id number] at 3722w6d was admitted in Latent Labor with SROM on 04/13/2016. Patient had an uncomplicated labor course as follows:  Membrane Rupture Time/Date: 4:16 AM ,04/14/2016   Intrapartum Procedures: Episiotomy: None [1]                                         Lacerations:  None [1]  Patient had a delivery of a Viable infant. 04/14/2016  Information for the patient's newborn:  Matherly, Girl Cathlean CowerLesley [[id number]][[id number]]  Delivery Method: Vag-Spont    Pateint had an uncomplicated postpartum course.  She is ambulating, tolerating a regular diet, passing flatus, and urinating well. Patient is discharged home in stable condition on 04/15/16.   Physical exam  Vitals:   04/14/16 1223 04/14/16 1330 04/14/16 1618 04/15/16 0521  BP: 114/71 117/79 122/65 120/72  Pulse: 90 85 69 78  Resp: 18 18 18 18   Temp: 98 F (36.7 C) 97.9 F (36.6 C) 98.2 F (36.8 C) 98.1 F (36.7 C)  TempSrc: Oral Oral Oral Oral  SpO2:      Weight:      Height:       General: alert Lochia: appropriate Uterine Fundus: firm  Labs: Lab Results  Component Value Date   WBC 10.1 04/15/2016   HGB 10.2 (L) 04/15/2016   HCT 28.8 (L) 04/15/2016   MCV 90.9 04/15/2016   PLT 168 04/15/2016   CMP Latest Ref Rng & Units 06/11/2010  Glucose 70 - 99 mg/dL 65(H66(L)  BUN 6 - 23 mg/dL 7  Creatinine 0.4 - 1.2 mg/dL 0.6  Sodium 846135 - 962145 mEq/L 140  Potassium 3.5 - 5.1 mEq/L 3.9  Chloride 96 - 112 mEq/L 102  CO2 19 - 32 mEq/L 30   Calcium 8.4 - 10.5 mg/dL 9.4  Total Protein 6.0 - 8.3 g/dL 7.2  Total Bilirubin 0.3 - 1.2 mg/dL 0.5  Alkaline Phos 39 - 117 U/L 84  AST 0 - 37 U/L 18  ALT 0 - 35 U/L 14    Discharge instruction: per After Visit Summary and "Baby and Me Booklet".  After visit meds:  Allergies as of 04/15/2016      Reactions   Latex       Medication List    STOP taking these medications   ranitidine 75 MG tablet Commonly known as:  ZANTAC     TAKE these medications   ibuprofen 600 MG tablet Commonly known as:  ADVIL,MOTRIN Take 1 tablet (600 mg total) by mouth every 6 (six) hours.   prenatal multivitamin Tabs tablet Take 1 tablet by mouth daily at 12 noon.       Diet: routine diet  Activity: Advance as tolerated. Pelvic rest for 6  weeks.   Outpatient follow up:6 weeks   Newborn Data: Live born female  Birth Weight: 7 lb 11.6 oz (3505 g) APGAR: 9, 9  Baby Feeding: Breast Disposition:home with mother   04/15/2016 Zenaida Niece, MD

## 2016-04-15 NOTE — Lactation Note (Signed)
This note was copied from a baby's chart. Lactation Consultation Note  Patient Name: Yvette Trevino ZOXWR'UToday's Date: 04/15/2016 Reason for consult: Follow-up assessment  Baby 24 hours old, sleeping in crib. Mom reports that nursing is going well and she believes that she has more breast milk with this baby. Discussed progression of milk coming to volume and enc some additional pumping and hand expression after breastfeeding. Discussed how to give baby any EBM and EBM storage guidelines. Discussed ways of knowing that baby getting enough at the breast including intake and output. Mom given hand pump with review. Mom aware of OP/BFSG and LC phone line assistance after D/C.   Maternal Data    Feeding    LATCH Score/Interventions                      Lactation Tools Discussed/Used     Consult Status Consult Status: PRN    Sherlyn HayJennifer D Damoney Julia 04/15/2016, 10:18 AM

## 2016-04-15 NOTE — Progress Notes (Signed)
PPD #1 No problems, cramps with breastfeeding, wants to go home Afeb, VSS Fundus firm, NT at U-1 Continue routine postpartum care, d/c home

## 2016-04-15 NOTE — Discharge Instructions (Signed)
As per discharge pamphlet °

## 2018-01-10 IMAGING — US US MFM OB LIMITED
1 series · 15 of 28 positions shown · non-contrast
Comparison: none

[Series 1: us mfm ob limited · 15 of 57 slices shown]
[im 1/57]
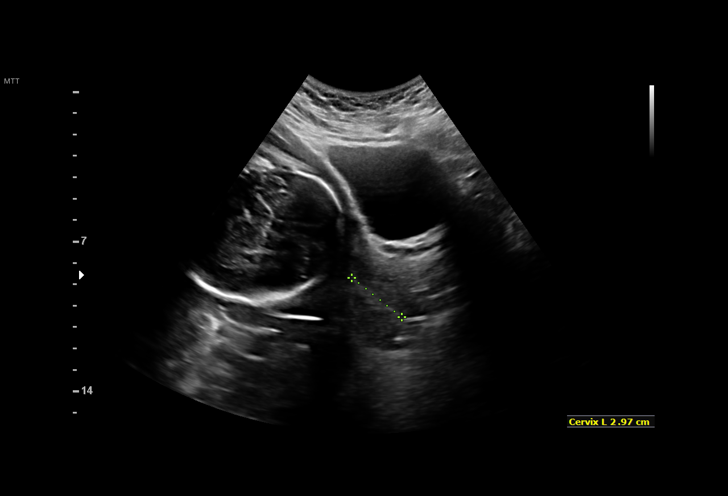
[im 5/57]
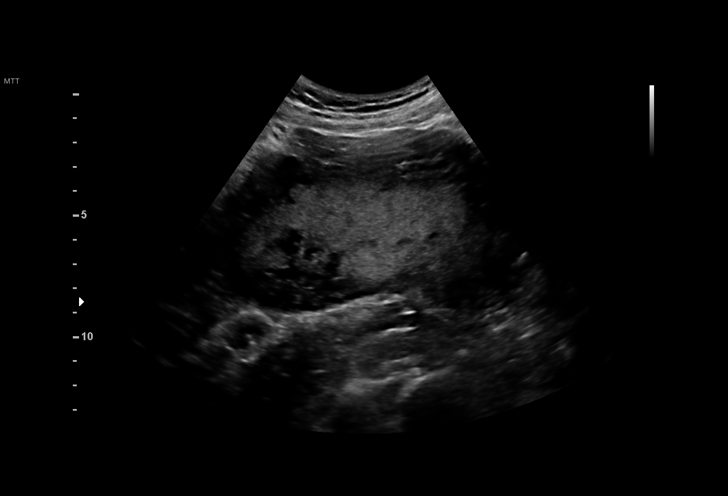
[im 9/57]
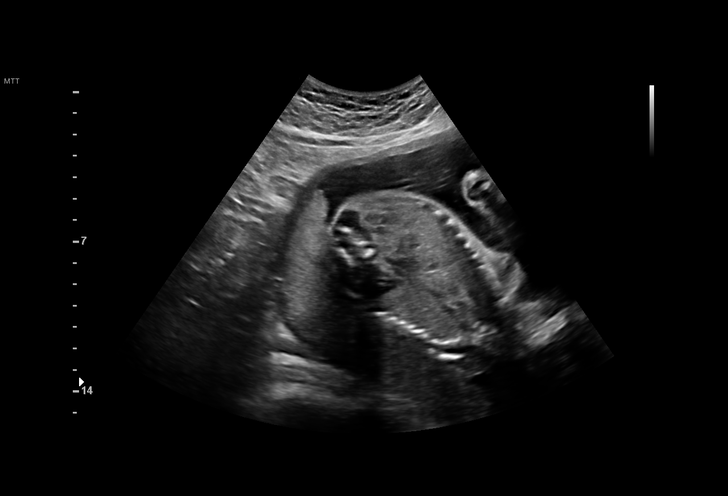
[im 13/57]
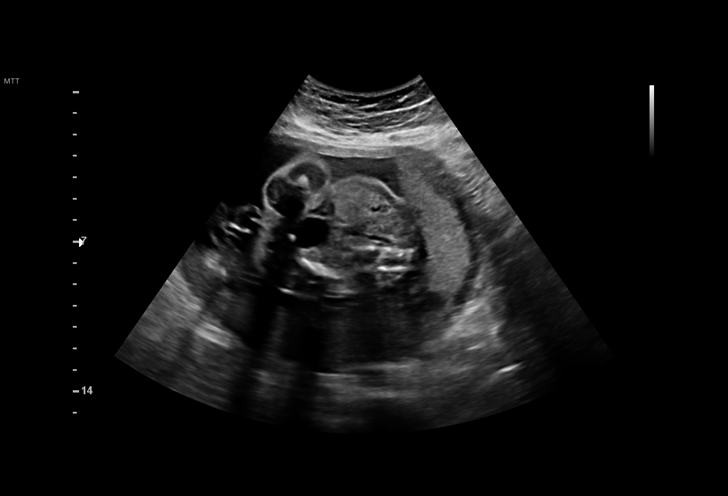
[im 17/57]
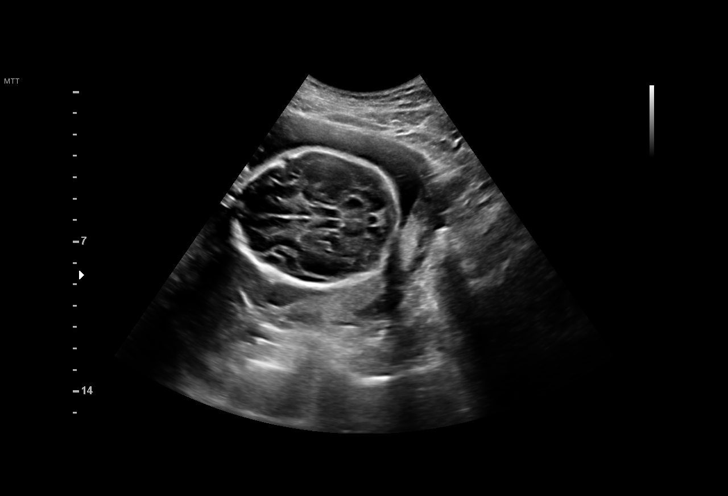
[im 21/57]
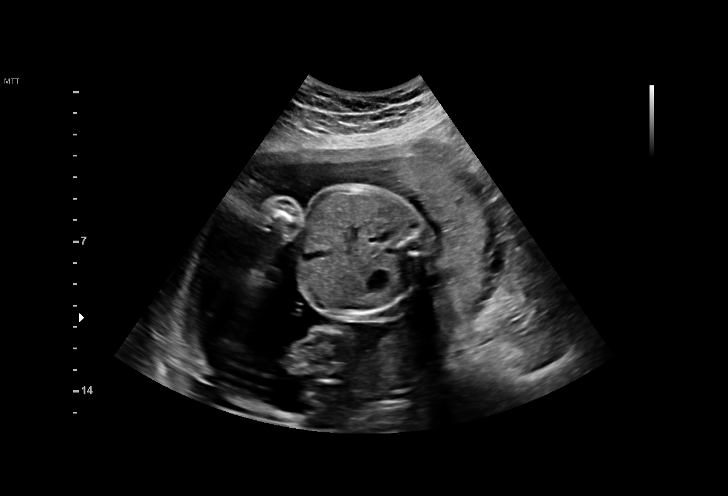
[im 25/57]
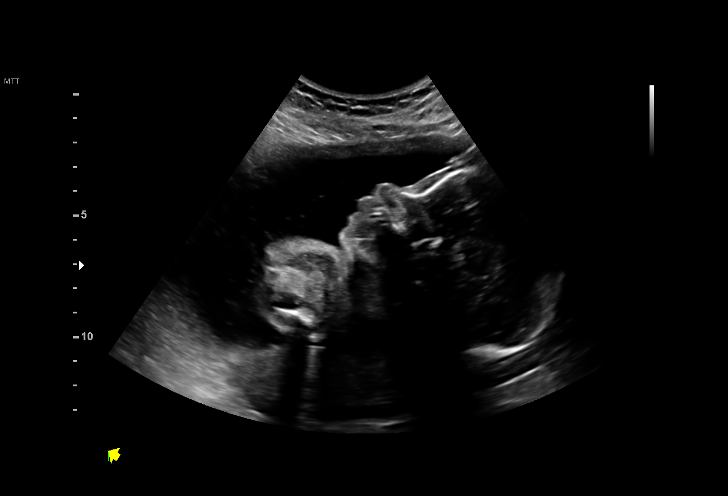
[im 30/57]
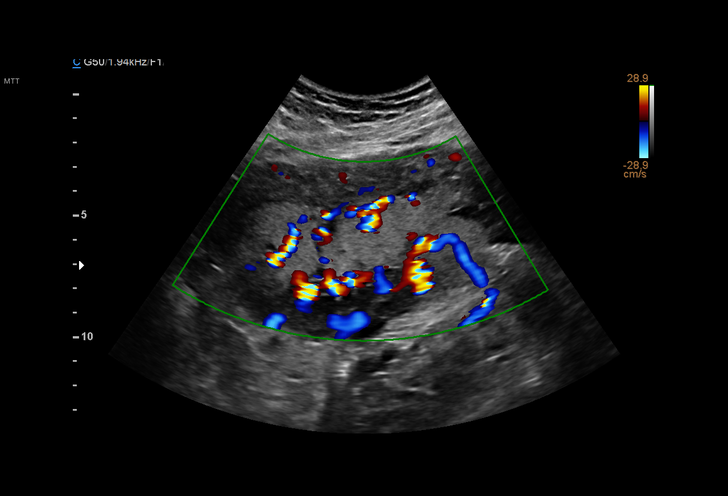
[im 32/57]
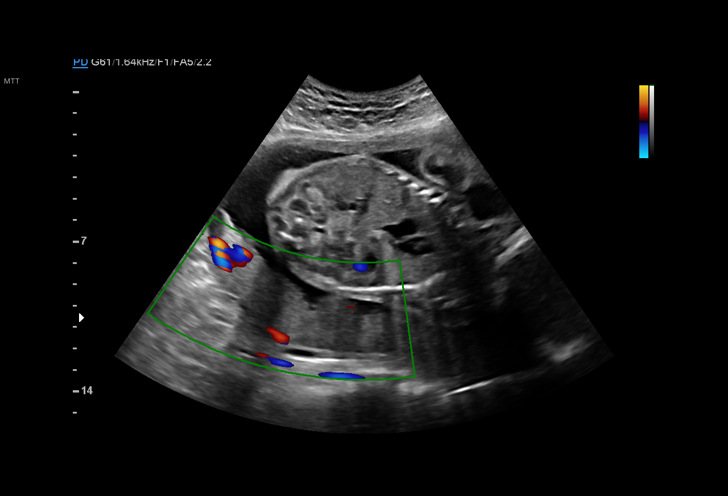
[im 36/57]
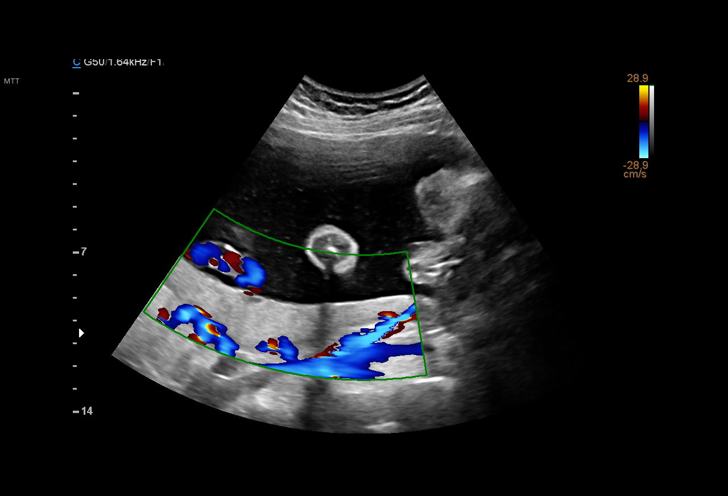
[im 40/57]
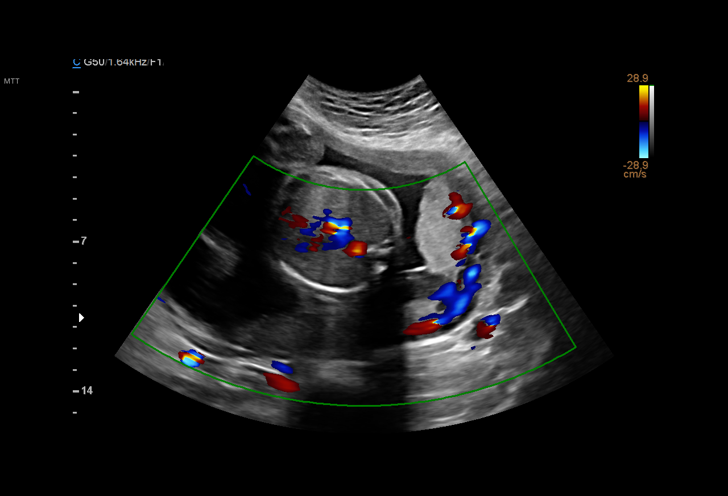
[im 44/57]
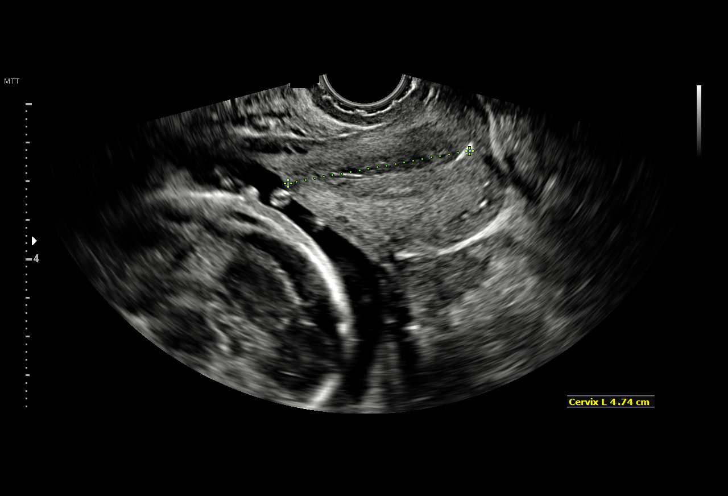
[im 48/57]
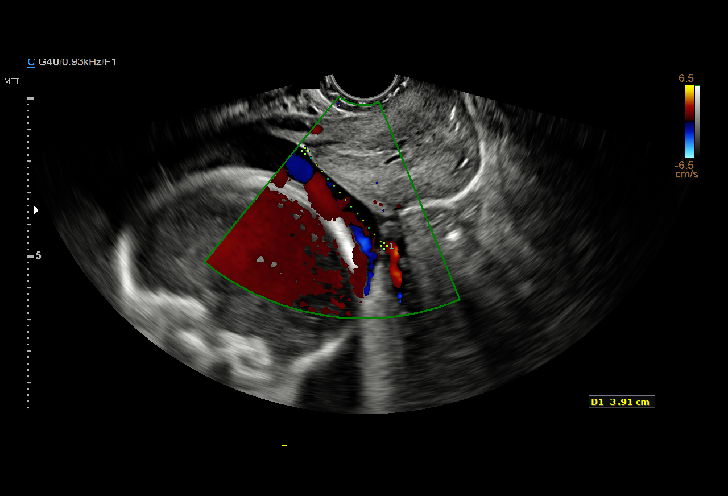
[im 52/57]
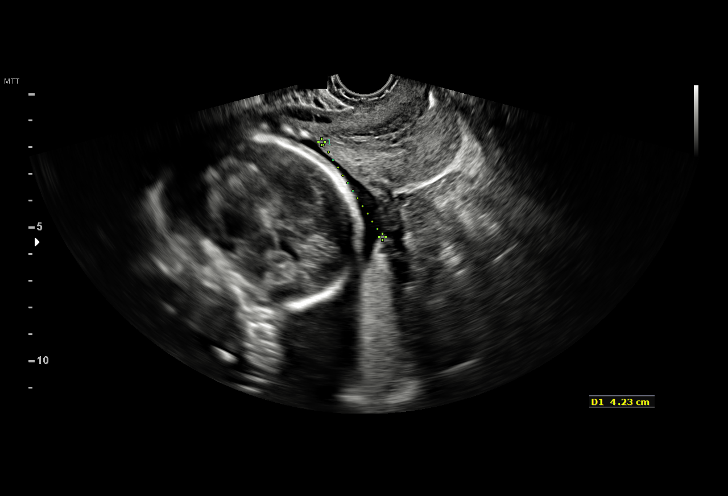
[im 57/57]
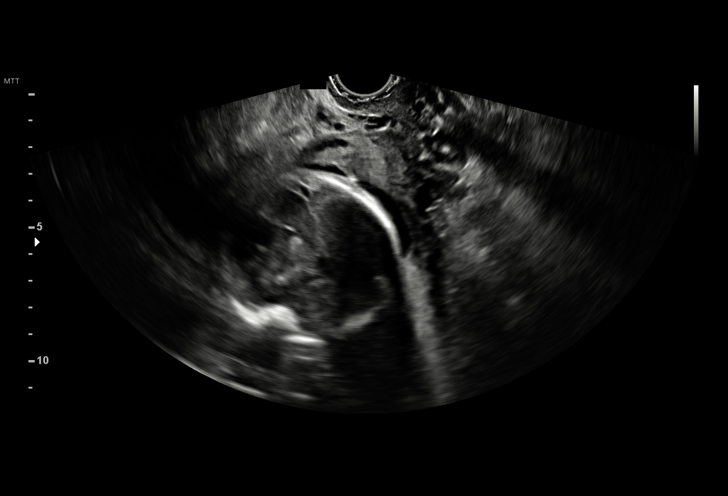

[15 of 28 positions shown; findings below may reference images not displayed]

MAU/Triage

Indications

25 weeks gestation of pregnancy
Placenta previa specified as without
hemorrhage, second trimester
Traumatic injury during pregnancy (fall)
OB History

Blood Type:            Height:  5'1"   Weight (lb):  141      BMI:
Gravidity:    2         Term:   1        Prem:   0        SAB:   0
TOP:          0       Ectopic:  0        Living: 1
Fetal Evaluation

Num Of Fetuses:     1
Fetal Heart         144
Rate(bpm):
Cardiac Activity:   Observed
Presentation:       Cephalic
Placenta:           Left Posterior, above cervical os
P. Cord Insertion:  Not well visualized

Amniotic Fluid
AFI FV:      Subjectively upper-normal

Largest Pocket(cm)
9.7

Comment:    No subchorionic hemorrhage seen. No placental abruption or
previa identified.
Gestational Age

Clinical EDD:  25w 1d                                        EDD:   04/08/16
Best:          25w 1d    Det. By:   Clinical EDD             EDD:   04/08/16
Cervix Uterus Adnexa

Cervix
Length:           4.06  cm.
Normal appearance by transvaginal scan

Uterus
No abnormality visualized.

Left Ovary
No adnexal mass visualized.

Right Ovary
No adnexal mass visualized.

Cul De Sac:   No free fluid seen.

Adnexa:       No abnormality visualized.
Impression

Single IUP at 25w 1d
Limited ultrasound performed due to fall
Left posterior placenta without previa
No ultrasound findings suspicious for abruption were noted
High / normal amniotic fluid volume
Recommendations

Follow-up ultrasounds as clinically indicated.

## 2018-01-10 IMAGING — CR DG ANKLE 2V *R*
2 series · 2 of 2 positions shown · non-contrast
Comparison: None.

CLINICAL DATA: Fall today with right ankle injury

EXAM:
RIGHT ANKLE - 2 VIEW

[ankle ap]
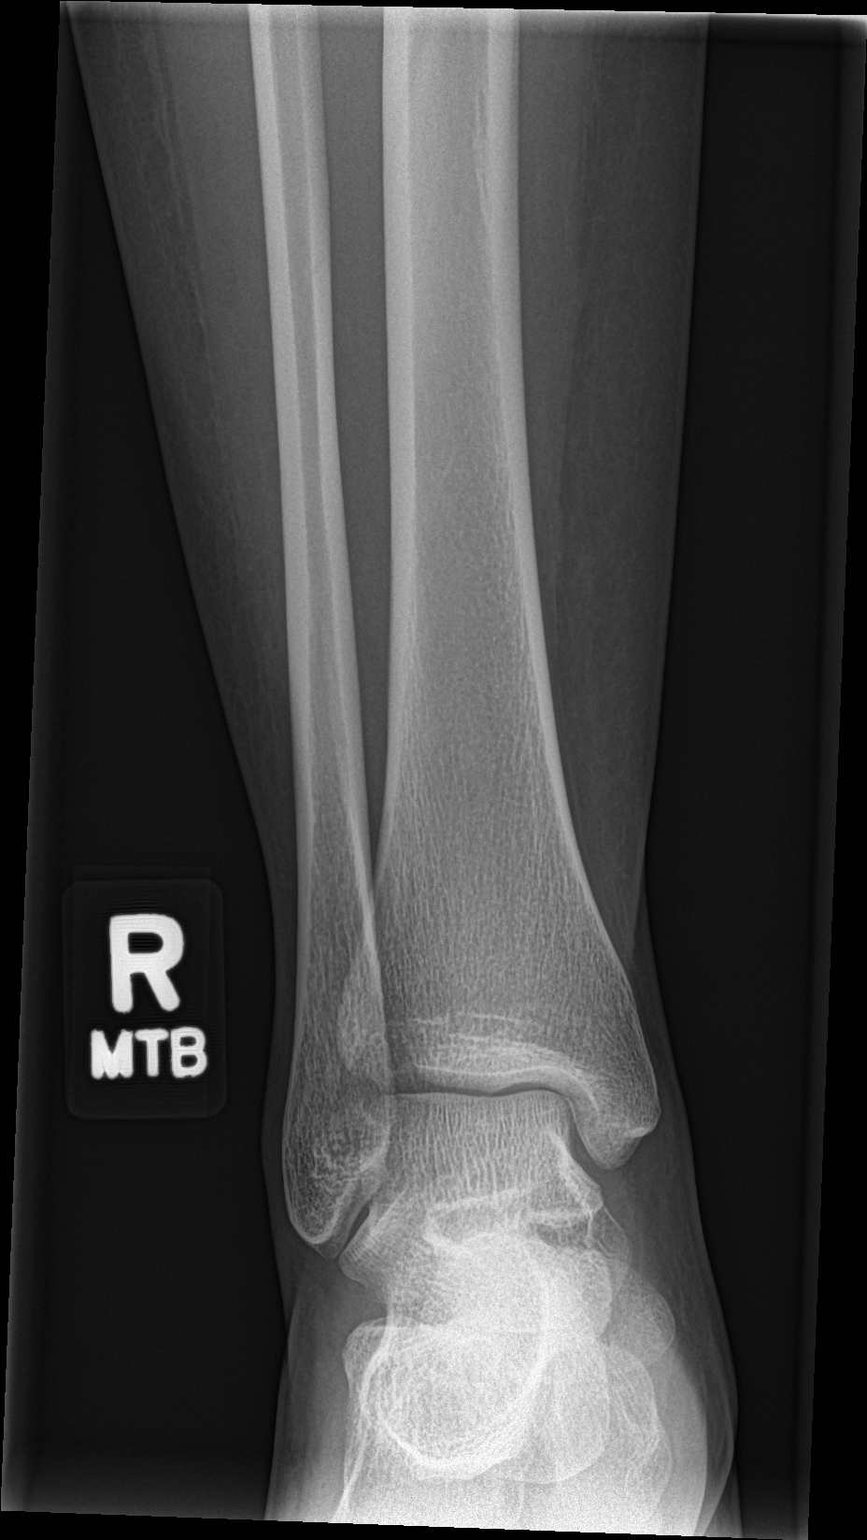

[ankle lat]
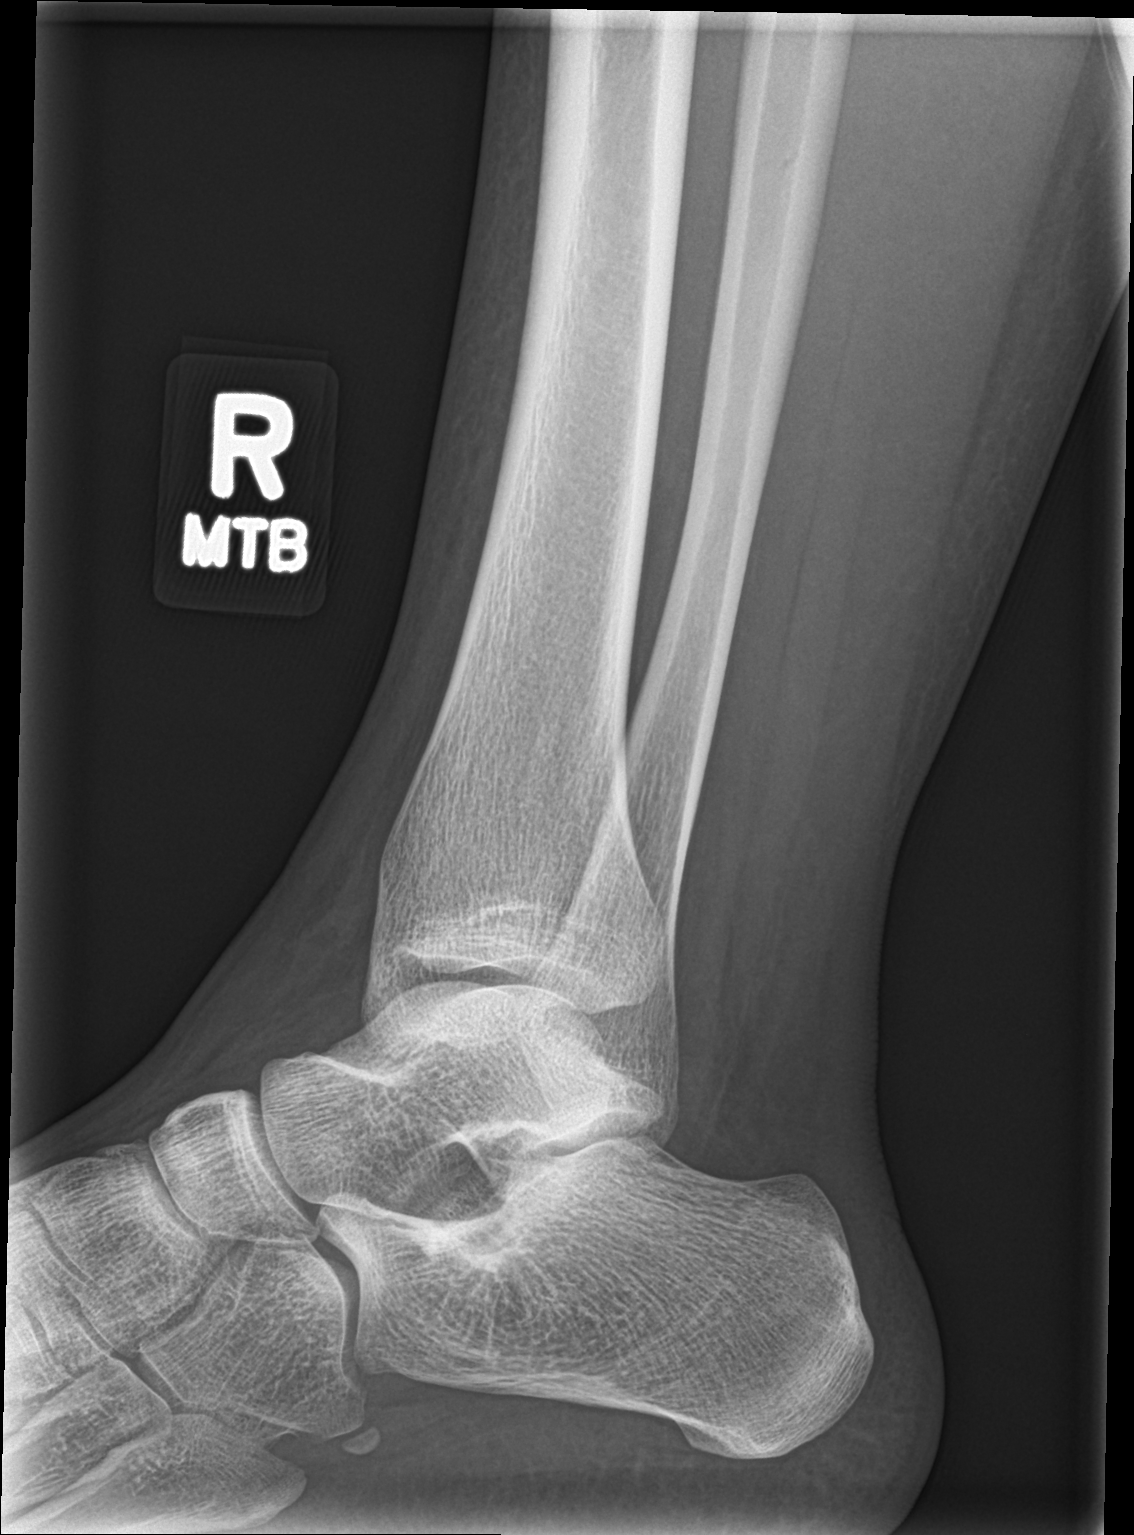

[2 of 2 positions shown; findings below may reference images not displayed]

FINDINGS: There is no evidence of fracture, dislocation, or joint effusion.
There is no evidence of arthropathy or other focal bone abnormality.
Soft tissues are unremarkable.
IMPRESSION: Negative.

## 2018-01-10 IMAGING — CR DG FOOT COMPLETE 3+V*R*
3 series · 3 of 3 positions shown · non-contrast
Comparison: 07/30/2007 right foot radiographs

CLINICAL DATA: Fall with right ankle injury today

EXAM:
RIGHT FOOT COMPLETE - 3+ VIEW

[foot ap]
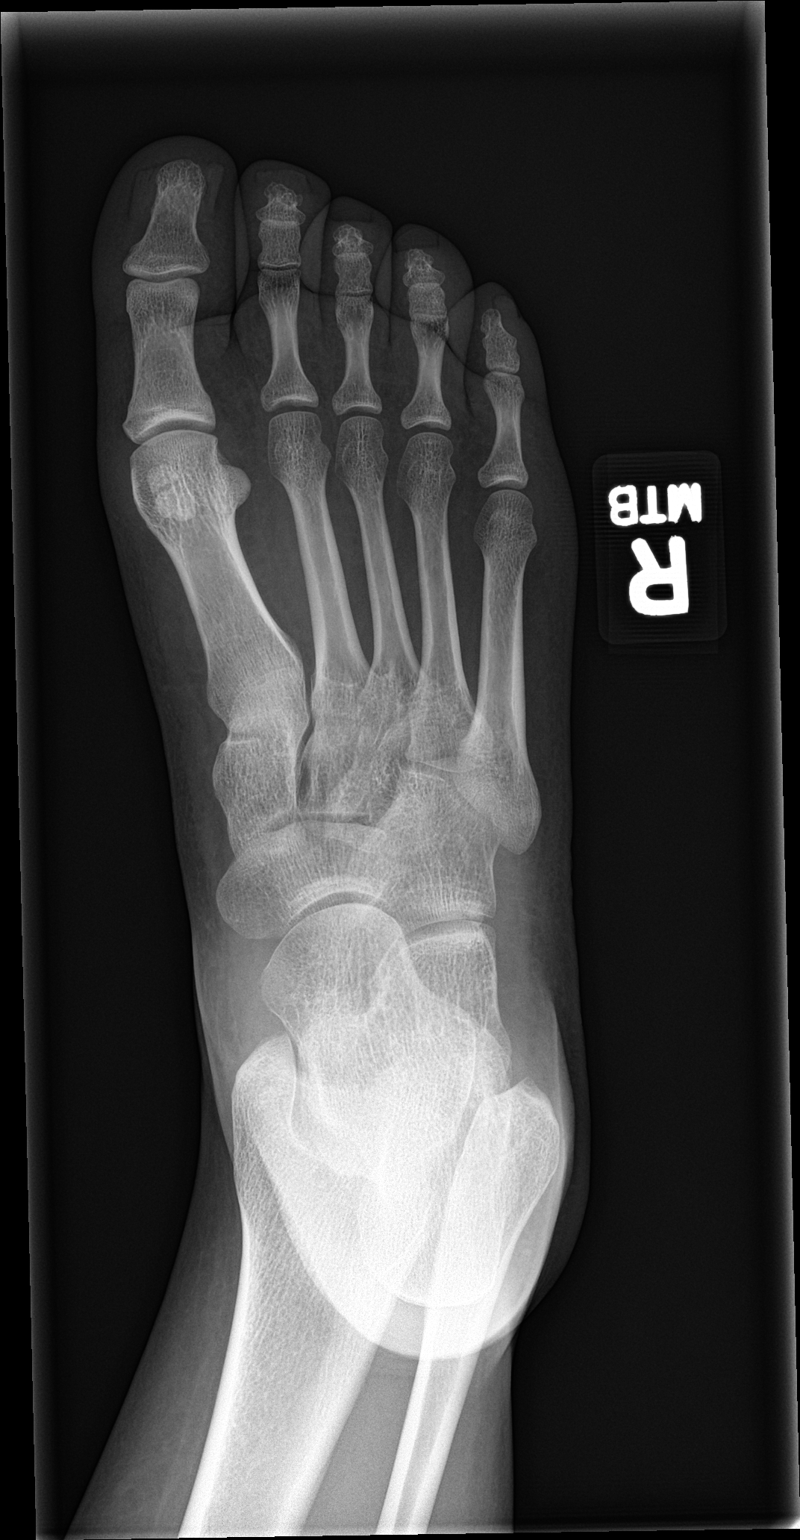

[foot obl]
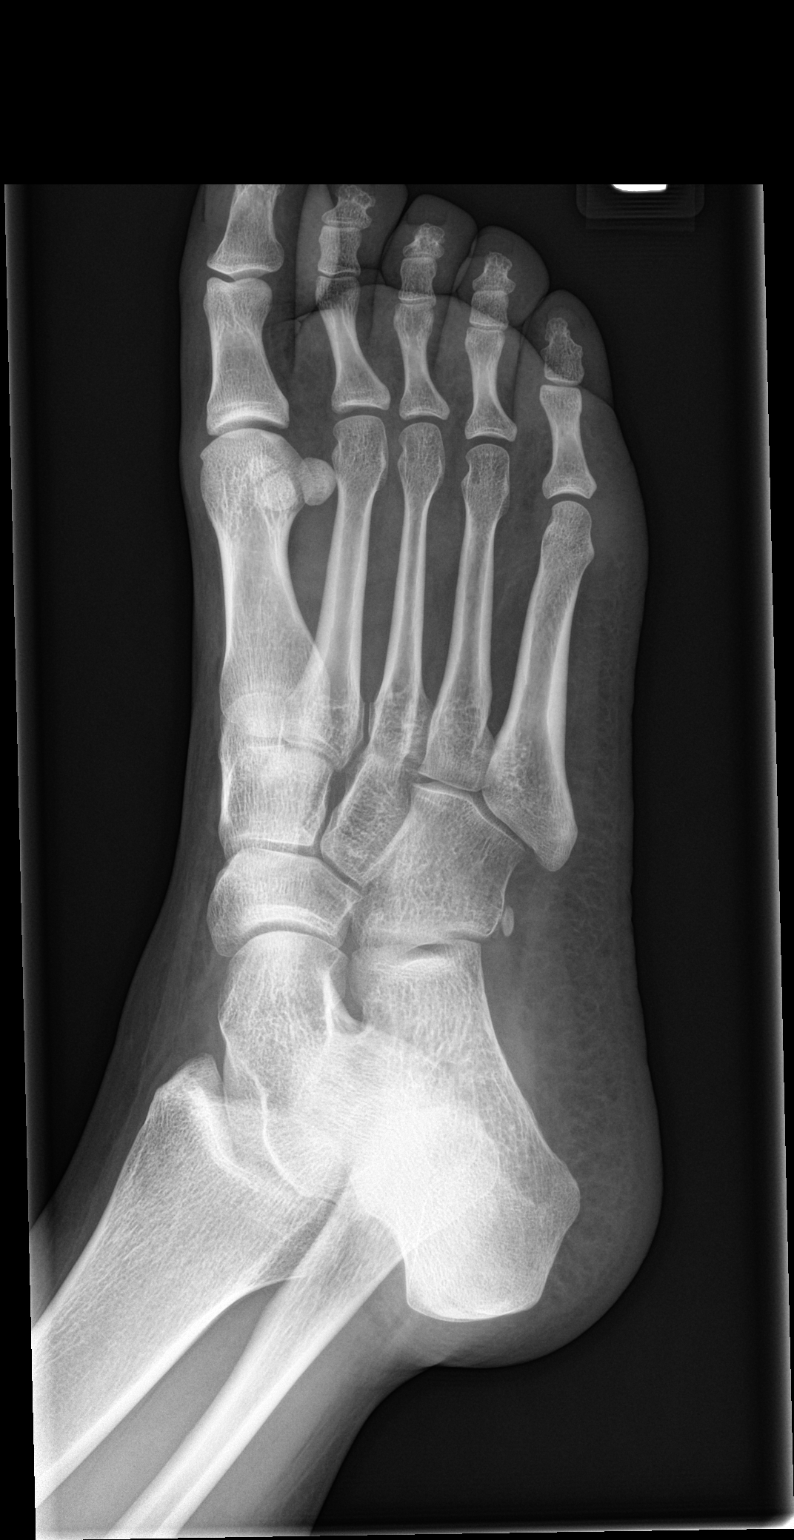

[foot lat]
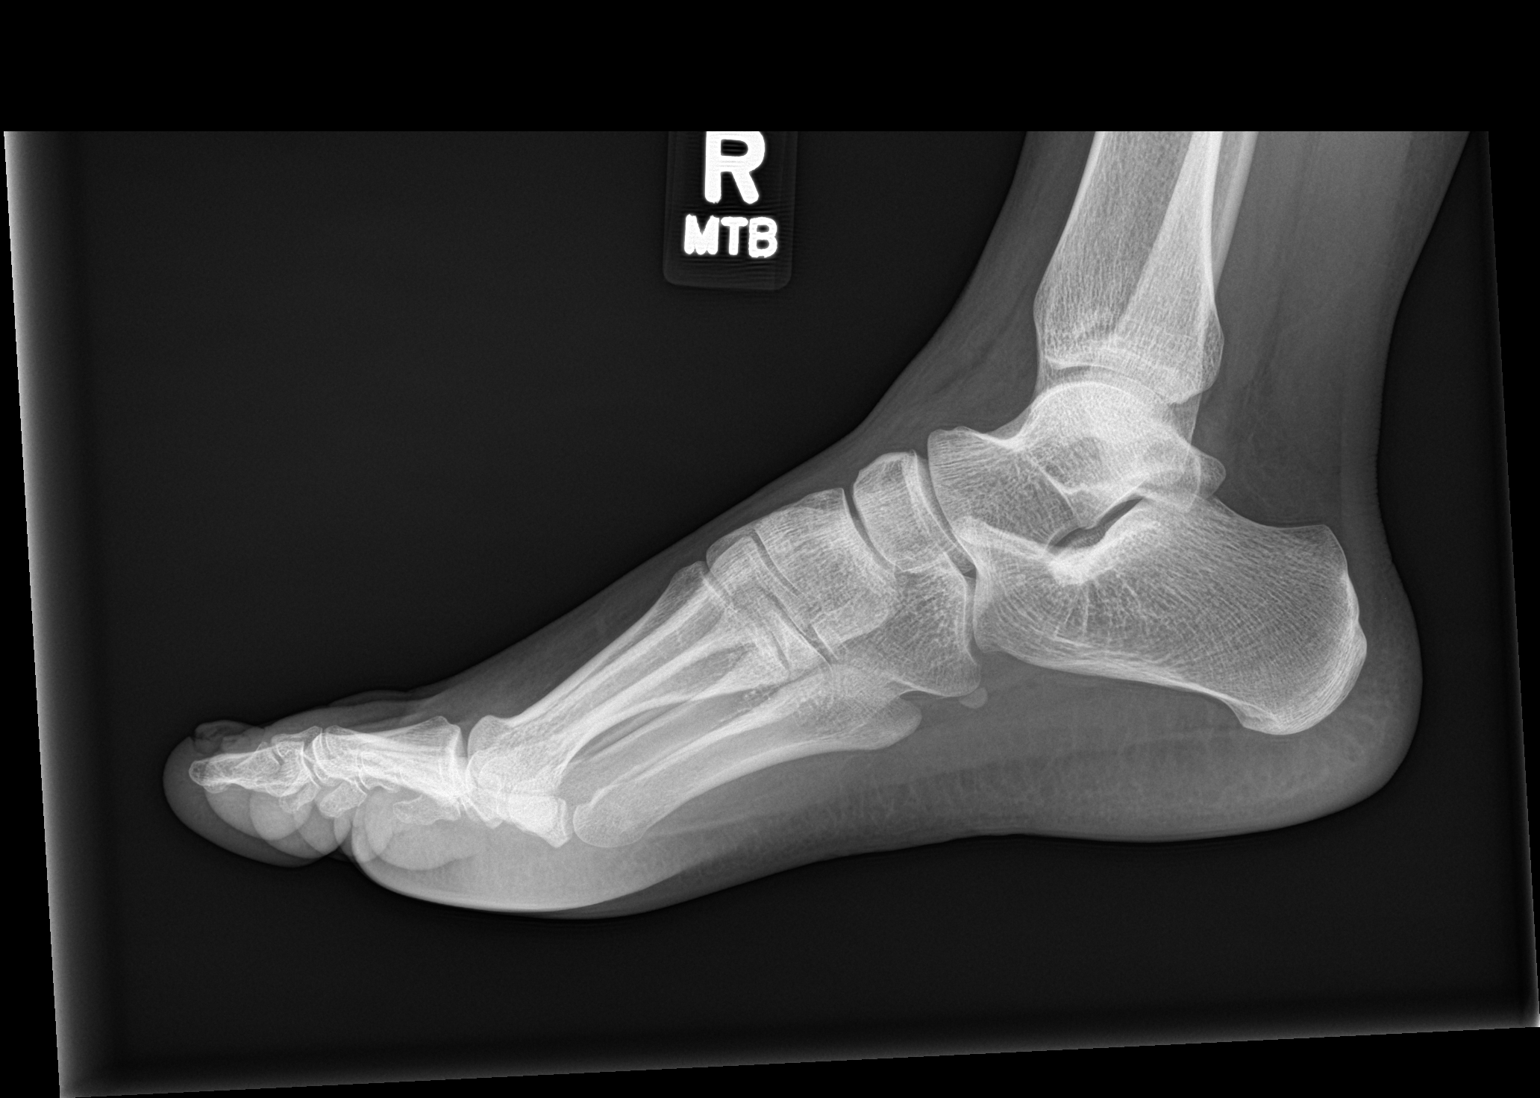

[3 of 3 positions shown; findings below may reference images not displayed]

FINDINGS: There is no evidence of fracture or dislocation. There is no
evidence of arthropathy or other focal bone abnormality. Soft
tissues are unremarkable.
IMPRESSION: Negative.

## 2018-11-02 ENCOUNTER — Other Ambulatory Visit: Payer: Self-pay

## 2018-11-02 DIAGNOSIS — Z20822 Contact with and (suspected) exposure to covid-19: Secondary | ICD-10-CM

## 2018-11-03 LAB — NOVEL CORONAVIRUS, NAA: SARS-CoV-2, NAA: NOT DETECTED

## 2019-07-25 ENCOUNTER — Other Ambulatory Visit: Payer: Self-pay

## 2019-07-25 ENCOUNTER — Encounter: Payer: Self-pay | Admitting: Family Medicine

## 2019-07-25 ENCOUNTER — Ambulatory Visit (INDEPENDENT_AMBULATORY_CARE_PROVIDER_SITE_OTHER): Payer: 59 | Admitting: Family Medicine

## 2019-07-25 VITALS — BP 122/82 | HR 72 | Temp 98.7°F | Ht 60.0 in | Wt 143.4 lb

## 2019-07-25 DIAGNOSIS — M25562 Pain in left knee: Secondary | ICD-10-CM | POA: Diagnosis not present

## 2019-07-25 DIAGNOSIS — F329 Major depressive disorder, single episode, unspecified: Secondary | ICD-10-CM | POA: Diagnosis not present

## 2019-07-25 DIAGNOSIS — F419 Anxiety disorder, unspecified: Secondary | ICD-10-CM | POA: Diagnosis not present

## 2019-07-25 DIAGNOSIS — G8929 Other chronic pain: Secondary | ICD-10-CM | POA: Diagnosis not present

## 2019-07-25 DIAGNOSIS — F32A Depression, unspecified: Secondary | ICD-10-CM

## 2019-07-25 MED ORDER — SERTRALINE HCL 50 MG PO TABS: 50.0000 mg | ORAL_TABLET | Freq: Every day | ORAL | 3 refills | Status: DC

## 2019-07-25 MED ORDER — DICLOFENAC SODIUM 75 MG PO TBEC: 75.0000 mg | DELAYED_RELEASE_TABLET | Freq: Two times a day (BID) | ORAL | 1 refills | Status: AC | PRN

## 2019-07-25 NOTE — Patient Instructions (Addendum)
Start sertraline 1/2 tab (25mg ) every morning for one week. Then increase to 1 tab (50mg ) daily every morning.    If you have lab work done today you will be contacted with your lab results within the next 2 weeks.  If you have not heard from then please contact . The fastest way to get your results is to register for My Chart.   IF you received an x-ray today, you will receive an invoice from [facility]. Please contact [facility] at [phone] with questions or concerns regarding your invoice.   IF you received labwork today, you will receive an invoice from [facility]. Please contact [facility] at [phone] with questions or concerns regarding your invoice.   Our billing staff will not be able to assist you with questions regarding bills from these companies.  You will be contacted with the lab results as soon as they are available. The fastest way to get your results is to activate your My Chart account. Instructions are located on the last page of this paperwork. If you have not heard from [facility] regarding the results in 2 weeks, please contact this office.

## 2019-07-25 NOTE — Progress Notes (Signed)
5/25/20213:32 PM  Yvette Trevino May 26, 1990, 29 y.o., female [id number]  Chief Complaint  Patient presents with  . New Patient (Initial Visit)    Anx 18/Dep 18    HPI:   Patient is a 29 y.o. female with past medical history significant for depression and anxiety who presents today to establish care  Depression and anxiety present for past 2-3 years Started after her husband got deported to [place] Tried counseling - unable to take time off for counseling In the past tried lexapro - did not find it helpful She has never been hospitalized She had to move in with her parents phq9 and gad7 noted  Requesting refill for diclofenac which she takes prn for her left knee pain, was told she had ligament damage while in high school   GAD 7 : Generalized Anxiety Score 07/25/2019  Nervous, Anxious, on Edge 2  Control/stop worrying 3  Worry too much - different things 3  Trouble relaxing 2  Restless 3  Easily annoyed or irritable 3  Afraid - awful might happen 2  Total GAD 7 Score 18  Anxiety Difficulty Not difficult at all     Depression screen [facility] 2/9 07/25/2019  Decreased Interest 2  Down, Depressed, Hopeless 2  PHQ - 2 Score 4  Altered sleeping 3  Tired, decreased energy 3  Change in appetite 3  Feeling bad or failure about yourself  2  Trouble concentrating 2  Moving slowly or fidgety/restless 1  Suicidal thoughts 0  PHQ-9 Score 18  Difficult doing work/chores Not difficult at all    Fall Risk  07/25/2019  Falls in the past year? 0  Number falls in past yr: 0  Injury with Fall? 0  Follow up Falls evaluation completed     Allergies  Allergen Reactions  . Latex     Prior to Admission medications   Medication Sig Start Date End Date Taking? Authorizing Provider  diclofenac (VOLTAREN) 75 MG EC tablet Take 75 mg by mouth 2 (two) times daily.   Yes [provider]  ibuprofen (ADVIL,MOTRIN) 600 MG tablet Take 1 tablet (600 mg total) by mouth every 6 (six)  hours. Patient not taking: Reported on 07/25/2019 04/15/16   Meisinger, Sherren Mocha, MD  Prenatal Vit-Fe Fumarate-FA (PRENATAL MULTIVITAMIN) TABS tablet Take 1 tablet by mouth daily at 12 noon.    [provider]    Past Medical History:  Diagnosis Date  . Anxiety   . Depression   . UTI (urinary tract infection)   . Vitamin D deficiency     Past Surgical History:  Procedure Laterality Date  . CHOLECYSTECTOMY    . NO PAST SURGERIES      Social History   Tobacco Use  . Smoking status: Never Smoker  . Smokeless tobacco: Never Used  Substance Use Topics  . Alcohol use: Yes    Alcohol/week: 1.0 standard drinks    Types: 1 Standard drinks or equivalent per week    Family History  Problem Relation Age of Onset  . Breast cancer Maternal Grandmother   . Cancer Maternal Grandmother        thryoid  . Hypertension Mother   . Thyroid cancer Mother     ROS Per hpi  OBJECTIVE:  Today's Vitals   07/25/19 1503  BP: 122/82  Pulse: 72  Temp: 98.7 F (37.1 C)  TempSrc: Temporal  SpO2: 98%  Weight: 143 lb 6.4 oz (65 kg)  Height: 5' (1.524 m)   Body mass index  is 28.01 kg/m.   Physical Exam Vitals and nursing note reviewed.  Constitutional:      Appearance: She is well-developed.  HENT:     Head: Normocephalic and atraumatic.  Eyes:     General: No scleral icterus.    Conjunctiva/sclera: Conjunctivae normal.     Pupils: Pupils are equal, round, and reactive to light.  Pulmonary:     Effort: Pulmonary effort is normal.  Musculoskeletal:     Cervical back: Neck supple.  Skin:    General: Skin is warm and dry.  Neurological:     Mental Status: She is alert and oriented to person, place, and time.       No results found for this or any previous visit (from the past 24 hour(s)).  No results found.   ASSESSMENT and PLAN  1. Anxiety and depression Not controlled. Discussed treatment options. Trial of sertraline. Reviewed r/se/b. Declines referral to  counseling at this time.  - TSH  2. Chronic pain of left knee Manages with diclofenac prn. Reviewed r/se/b. Declines referral at this time.   PMH, PSH, meds, allergies, Fhx, Shx, reviewed with patient today.  Other orders - sertraline (ZOLOFT) 50 MG tablet; Take 1 tablet (50 mg total) by mouth daily. - diclofenac (VOLTAREN) 75 MG EC tablet; Take 1 tablet (75 mg total) by mouth 2 (two) times daily as needed.  Return in about 4 weeks (around 08/22/2019) for depression and anxiety.    Myles Lipps, MD Primary Care at [facility] [address] [place], [place] [address] Ph.  [phone] Fax [phone]

## 2019-07-26 LAB — TSH: TSH: 1.18 u[IU]/mL (ref 0.450–4.500)

## 2019-08-15 ENCOUNTER — Ambulatory Visit: Payer: 59 | Admitting: Family Medicine

## 2022-02-18 ENCOUNTER — Other Ambulatory Visit: Payer: Self-pay | Admitting: Internal Medicine

## 2022-03-25 ENCOUNTER — Other Ambulatory Visit: Payer: Self-pay | Admitting: Internal Medicine

## 2022-03-25 DIAGNOSIS — M7989 Other specified soft tissue disorders: Secondary | ICD-10-CM

## 2022-03-30 ENCOUNTER — Other Ambulatory Visit: Payer: Self-pay

## 2022-03-30 DIAGNOSIS — M7989 Other specified soft tissue disorders: Secondary | ICD-10-CM

## 2022-04-15 LAB — HEPATIC FUNCTION PANEL
ALT: 20 IU/L (ref 0–32)
AST: 21 IU/L (ref 0–40)
Albumin: 4.3 g/dL (ref 3.9–4.9)
Alkaline Phosphatase: 104 IU/L (ref 44–121)
Bilirubin Total: 0.4 mg/dL (ref 0.0–1.2)
Bilirubin, Direct: 0.12 mg/dL (ref 0.00–0.40)
Total Protein: 7.2 g/dL (ref 6.0–8.5)

## 2022-04-15 LAB — LIPID PANEL WITH LDL/HDL RATIO
Cholesterol, Total: 155 mg/dL (ref 100–199)
HDL: 38 mg/dL — ABNORMAL LOW (ref >39)
LDL Chol Calc (NIH): 98 mg/dL (ref 0–99)
LDL/HDL Ratio: 2.6 ratio (ref 0.0–3.2)
Triglycerides: 103 mg/dL (ref 0–149)
VLDL Cholesterol Cal: 19 mg/dL (ref 5–40)

## 2022-04-15 LAB — TSH+T4F+T3FREE
Free T4: 1.33 ng/dL (ref 0.82–1.77)
T3, Free: 3.8 pg/mL (ref 2.0–4.4)
TSH: 2.17 u[IU]/mL (ref 0.450–4.500)

## 2022-04-15 LAB — CBC
Hematocrit: 42.9 % (ref 34.0–46.6)
Hemoglobin: 14.9 g/dL (ref 11.1–15.9)
MCH: 32.3 pg (ref 26.6–33.0)
MCHC: 34.7 g/dL (ref 31.5–35.7)
MCV: 93 fL (ref 79–97)
Platelets: 215 10*3/uL (ref 150–450)
RBC: 4.61 x10E6/uL (ref 3.77–5.28)
RDW: 11.7 % (ref 11.7–15.4)
WBC: 7.7 10*3/uL (ref 3.4–10.8)

## 2022-06-25 ENCOUNTER — Encounter: Payer: Self-pay | Admitting: Internal Medicine

## 2022-06-25 ENCOUNTER — Other Ambulatory Visit: Payer: Self-pay | Admitting: Internal Medicine

## 2022-06-25 MED ORDER — FLUCONAZOLE 150 MG PO TABS: 150.0000 mg | ORAL_TABLET | Freq: Every day | ORAL | 0 refills | Status: DC

## 2022-06-25 MED ORDER — AZITHROMYCIN 250 MG PO TABS: ORAL_TABLET | ORAL | 3 refills | Status: DC

## 2022-06-26 ENCOUNTER — Other Ambulatory Visit: Payer: Self-pay | Admitting: Internal Medicine

## 2022-06-26 MED ORDER — SULFAMETHOXAZOLE-TRIMETHOPRIM 800-160 MG PO TABS: 1.0000 | ORAL_TABLET | Freq: Two times a day (BID) | ORAL | 2 refills | Status: AC

## 2022-07-01 ENCOUNTER — Other Ambulatory Visit: Payer: Self-pay

## 2022-07-01 MED ORDER — FLUCONAZOLE 150 MG PO TABS: 150.0000 mg | ORAL_TABLET | Freq: Every day | ORAL | 3 refills | Status: DC

## 2022-07-28 ENCOUNTER — Other Ambulatory Visit: Payer: Self-pay | Admitting: Internal Medicine

## 2022-07-28 MED ORDER — AMLODIPINE-VALSARTAN-HCTZ 5-160-12.5 MG PO TABS: 1.0000 | ORAL_TABLET | Freq: Every day | ORAL | 3 refills | Status: DC

## 2022-07-31 ENCOUNTER — Other Ambulatory Visit: Payer: Self-pay

## 2022-07-31 MED ORDER — TOPIRAMATE 25 MG PO TABS: 25.0000 mg | ORAL_TABLET | Freq: Two times a day (BID) | ORAL | 11 refills | Status: DC

## 2022-07-31 MED ORDER — FLUCONAZOLE 150 MG PO TABS: 150.0000 mg | ORAL_TABLET | Freq: Every day | ORAL | 11 refills | Status: AC

## 2022-10-20 NOTE — Progress Notes (Unsigned)
HPI: [name] is a 32 y.o. female, who is here today to establish care.  Former PCP: Dr Yvette Trevino. Last preventive routine visit: 2012 Requesting a CPE today. She needs school form to be completed. Planning on starting nursing school.  She reports a history of anxiety and elevated blood pressure, for which she was previously prescribed antihypertensive medication. However, she has not taken this medication for approximately one year and states that her blood pressure readings at home have been within the range of 120-130/80.   Additionally, she has a history of depression and binge eating, for which she was prescribed Celexa, but she has not taken this medication for the past three months. Problem was exacerbated by her husband deportation to [place] 5 years ago. Denies depression like symptoms at this time.  She expresses concern regarding her thyroid health, as her maternal grandmother and mother have thyroid issues.  She has undergone a thyroid check six months ago, which yielded normal results. Lab Results  Component Value Date   TSH 2.170 04/14/2022   Her lifestyle habits include an average sleep duration of 8 hours per night, no alcohol consumption, and she has never smoked.  She reports engaging in regular exercise, walking approximately five times per week.   Her gynecological history includes an IUD, which was last replaced one year ago, and her last menstrual period was in 2018.  She has been diligent with her routine pap smears, with the most recent one occurring in 2022, and she follows up with her gynecologist.  For school requirements, she needs titers for the MMR. She had a tuberculosis screening last month.  Lab Results  Component Value Date   NA 140 06/11/2010   CL 102 06/11/2010   K 3.9 06/11/2010   CO2 30 06/11/2010   BUN 7 06/11/2010   CREATININE 0.6 06/11/2010   CALCIUM 9.4 06/11/2010   ALBUMIN 4.3 04/14/2022   GLUCOSE 66 (L) 06/11/2010   Lab  Results  Component Value Date   CHOL 155 04/14/2022   HDL 38 (L) 04/14/2022   LDLCALC 98 04/14/2022   TRIG 103 04/14/2022   CHOLHDL 3 06/11/2010   Migraine headaches, 3-4 episodes per week. Bitemporal throbbing headache with associated N/V, and photophobia. She is not taking Topamax 25 mg bid, caused drowsiness. No new associated symptoms.  Review of Systems  Constitutional:  Negative for activity change, appetite change and fever.  HENT:  Negative for hearing loss, mouth sores, sore throat and trouble swallowing.   Eyes:  Negative for redness and visual disturbance.  Respiratory:  Negative for cough, shortness of breath and wheezing.   Cardiovascular:  Negative for chest pain and leg swelling.  Gastrointestinal:  Negative for abdominal pain, nausea and vomiting.       No changes in bowel habits.  Endocrine: Negative for cold intolerance, heat intolerance, polydipsia, polyphagia and polyuria.  Genitourinary:  Negative for decreased urine volume, dysuria, hematuria, vaginal bleeding and vaginal discharge.  Musculoskeletal:  Negative for gait problem and myalgias.  Skin:  Negative for color change and rash.  Allergic/Immunologic: Negative for environmental allergies.  Neurological:  Positive for headaches. Negative for seizures, syncope, facial asymmetry and weakness.  Hematological:  Negative for adenopathy. Does not bruise/bleed easily.  Psychiatric/Behavioral:  Negative for confusion and hallucinations.   All other systems reviewed and are negative.  Current Outpatient Medications on File Prior to Visit  Medication Sig Dispense Refill   diclofenac (VOLTAREN) 75 MG EC tablet Take 1 tablet (75 mg total)  by mouth 2 (two) times daily as needed. 60 tablet 1   fluconazole (DIFLUCAN) 150 MG tablet Take 1 tablet (150 mg total) by mouth daily. (Patient not taking: Reported on 10/21/2022) 1 tablet 11   Prenatal Vit-Fe Fumarate-FA (PRENATAL MULTIVITAMIN) TABS tablet Take 1 tablet by mouth  daily at 12 noon. (Patient not taking: Reported on 10/21/2022)     No current facility-administered medications on file prior to visit.   Past Medical History:  Diagnosis Date   Anxiety    Depression    UTI (urinary tract infection)    Vitamin D deficiency    Allergies  Allergen Reactions   Latex    Family History  Problem Relation Age of Onset   Breast cancer Maternal Grandmother    Cancer Maternal Grandmother        thryoid   Hypertension Mother    Thyroid cancer Mother    Social History   Socioeconomic History   Marital status: Married    Spouse name: Not on file   Number of children: Not on file   Years of education: Not on file   Highest education level: Not on file  Occupational History   Occupation: Dentist: [employer]    Comment: nursing  Tobacco Use   Smoking status: Never   Smokeless tobacco: Never  Substance and Sexual Activity   Alcohol use: Yes    Alcohol/week: 1.0 standard drink of alcohol    Types: 1 Standard drinks or equivalent per week   Drug use: No   Sexual activity: Yes    Birth control/protection: None  Other Topics Concern   Not on file  Social History Narrative   Not on file   Social Determinants of Health   Financial Resource Strain: Not on file  Food Insecurity: Not on file  Transportation Needs: Not on file  Physical Activity: Not on file  Stress: Not on file  Social Connections: Not on file   Vitals:   10/21/22 1406  BP: 124/82  Pulse: 75  Resp: 12  Temp: 98.7 F (37.1 C)  SpO2: 98%   Body mass index is 30.26 kg/m.  Physical Exam Vitals and nursing note reviewed.  Constitutional:      General: She is not in acute distress.    Appearance: She is well-developed.  HENT:     Head: Normocephalic and atraumatic.     Right Ear: Hearing, tympanic membrane, ear canal and external ear normal.     Left Ear: Hearing, tympanic membrane, ear canal and external ear normal.     Mouth/Throat:     Mouth:  Mucous membranes are moist.     Pharynx: Oropharynx is clear. Uvula midline.  Eyes:     Extraocular Movements: Extraocular movements intact.     Conjunctiva/sclera: Conjunctivae normal.     Pupils: Pupils are equal, round, and reactive to light.  Neck:     Thyroid: No thyroid mass.  Cardiovascular:     Rate and Rhythm: Normal rate and regular rhythm.     Pulses:          Dorsalis pedis pulses are 2+ on the right side and 2+ on the left side.     Heart sounds: No murmur heard. Pulmonary:     Effort: Pulmonary effort is normal. No respiratory distress.     Breath sounds: Normal breath sounds.  Abdominal:     Palpations: Abdomen is soft. There is no hepatomegaly or mass.  Tenderness: There is no abdominal tenderness.  Genitourinary:    Comments: Deferred to gyn. Musculoskeletal:     Comments: No major deformity or signs of synovitis appreciated.  Lymphadenopathy:     Cervical: No cervical adenopathy.     Upper Body:     Right upper body: No supraclavicular adenopathy.     Left upper body: No supraclavicular adenopathy.  Skin:    General: Skin is warm.     Findings: No erythema or rash.  Neurological:     General: No focal deficit present.     Mental Status: She is alert and oriented to person, place, and time.     Cranial Nerves: No cranial nerve deficit.     Coordination: Coordination normal.     Gait: Gait normal.     Deep Tendon Reflexes:     Reflex Scores:      Bicep reflexes are 2+ on the right side and 2+ on the left side.      Patellar reflexes are 2+ on the right side and 2+ on the left side. Psychiatric:        Mood and Affect: Mood and affect normal.   ASSESSMENT AND PLAN:  Ms. Yvette was seen today for annual exam and establish care.  Diagnoses and all orders for this visit:  Routine general medical examination at a health care facility Assessment & Plan: We discussed the importance of regular physical activity and healthy diet for prevention of chronic  illness and/or complications. Preventive guidelines reviewed. Vaccination up to date. Continue her female preventive care with her gyn. Next CPE in a year.   Encounter for HCV screening test for low risk patient -     Hepatitis C antibody; Future  Patient requested diagnostic testing -     TSH; Future  Screening for endocrine, metabolic and immunity disorder -     Basic metabolic panel; Future -     Hemoglobin A1c; Future  Measles, mumps, rubella (MMR) vaccination status unknown -     Measles/Mumps/Rubella Immunity; Future  Migraine without aura and without status migrainosus, not intractable Assessment & Plan: Having frequent episodes per week. Recommend trying Topamax 50 mg at bedtime. F/U in 6 months if she she decides to continue medication.   Return in 6 months (on 04/23/2023), or Migraine, for chronic problems.  Sanchez Trevino G. [place], MD  [facility]. [place] office.

## 2022-10-21 ENCOUNTER — Encounter: Payer: Self-pay | Admitting: Family Medicine

## 2022-10-21 ENCOUNTER — Ambulatory Visit (INDEPENDENT_AMBULATORY_CARE_PROVIDER_SITE_OTHER): Payer: Medicaid Other | Admitting: Family Medicine

## 2022-10-21 VITALS — BP 124/82 | HR 75 | Temp 98.7°F | Resp 12 | Ht 61.02 in | Wt 160.2 lb

## 2022-10-21 DIAGNOSIS — Z0000 Encounter for general adult medical examination without abnormal findings: Secondary | ICD-10-CM | POA: Insufficient documentation

## 2022-10-21 DIAGNOSIS — Z0189 Encounter for other specified special examinations: Secondary | ICD-10-CM

## 2022-10-21 DIAGNOSIS — Z130 Encounter for screening for diseases of the blood and blood-forming organs and certain disorders involving the immune mechanism: Secondary | ICD-10-CM

## 2022-10-21 DIAGNOSIS — Z1329 Encounter for screening for other suspected endocrine disorder: Secondary | ICD-10-CM

## 2022-10-21 DIAGNOSIS — Z13228 Encounter for screening for other metabolic disorders: Secondary | ICD-10-CM | POA: Diagnosis not present

## 2022-10-21 DIAGNOSIS — Z789 Other specified health status: Secondary | ICD-10-CM | POA: Diagnosis not present

## 2022-10-21 DIAGNOSIS — Z1159 Encounter for screening for other viral diseases: Secondary | ICD-10-CM

## 2022-10-21 DIAGNOSIS — G43009 Migraine without aura, not intractable, without status migrainosus: Secondary | ICD-10-CM

## 2022-10-21 NOTE — Patient Instructions (Addendum)
A few things to remember from today's visit:  Routine general medical examination at a health care facility  Encounter for HCV screening test for low risk patient - Plan: Hepatitis C antibody  Patient requested diagnostic testing - Plan: TSH  Screening for endocrine, metabolic and immunity disorder - Plan: Basic metabolic panel, Hemoglobin A1c  Measles, mumps, rubella (MMR) vaccination status unknown - Plan: Measles/Mumps/Rubella Immunity  If you need refills for medications you take chronically, please call your pharmacy. Do not use My Chart to request refills or for acute issues that need immediate attention. If you send a my chart message, it may take a few days to be addressed, specially if I am not in the office.  Please be sure medication list is accurate. If a new problem present, please set up appointment sooner than planned today.  Health Maintenance, Female Adopting a healthy lifestyle and getting preventive care are important in promoting health and wellness. Ask your health care provider about: The right schedule for you to have regular tests and exams. Things you can do on your own to prevent diseases and keep yourself healthy. What should I know about diet, weight, and exercise? Eat a healthy diet  Eat a diet that includes plenty of vegetables, fruits, low-fat dairy products, and lean protein. Do not eat a lot of foods that are high in solid fats, added sugars, or sodium. Maintain a healthy weight Body mass index (BMI) is used to identify weight problems. It estimates body fat based on height and weight. Your health care provider can help determine your BMI and help you achieve or maintain a healthy weight. Get regular exercise Get regular exercise. This is one of the most important things you can do for your health. Most adults should: Exercise for at least 150 minutes each week. The exercise should increase your heart rate and make you sweat (moderate-intensity  exercise). Do strengthening exercises at least twice a week. This is in addition to the moderate-intensity exercise. Spend less time sitting. Even light physical activity can be beneficial. Watch cholesterol and blood lipids Have your blood tested for lipids and cholesterol at 33 years of age, then have this test every 5 years. Have your cholesterol levels checked more often if: Your lipid or cholesterol levels are high. You are older than 33 years of age. You are at high risk for heart disease. What should I know about cancer screening? Depending on your health history and family history, you may need to have cancer screening at various ages. This may include screening for: Breast cancer. Cervical cancer. Colorectal cancer. Skin cancer. Lung cancer. What should I know about heart disease, diabetes, and high blood pressure? Blood pressure and heart disease High blood pressure causes heart disease and increases the risk of stroke. This is more likely to develop in people who have high blood pressure readings or are overweight. Have your blood pressure checked: Every 3-5 years if you are 68-11 years of age. Every year if you are 34 years old or older. Diabetes Have regular diabetes screenings. This checks your fasting blood sugar level. Have the screening done: Once every three years after age 40 if you are at a normal weight and have a low risk for diabetes. More often and at a younger age if you are overweight or have a high risk for diabetes. What should I know about preventing infection? Hepatitis B If you have a higher risk for hepatitis B, you should be screened for this virus. Talk  with your health care provider to find out if you are at risk for hepatitis B infection. Hepatitis C Testing is recommended for: Everyone born from [birth year] through 1966. Anyone with known risk factors for hepatitis C. Sexually transmitted infections (STIs) Get screened for STIs, including gonorrhea and  chlamydia, if: You are sexually active and are younger than 33 years of age. You are older than 33 years of age and your health care provider tells you that you are at risk for this type of infection. Your sexual activity has changed since you were last screened, and you are at increased risk for chlamydia or gonorrhea. Ask your health care provider if you are at risk. Ask your health care provider about whether you are at high risk for HIV. Your health care provider may recommend a prescription medicine to help prevent HIV infection. If you choose to take medicine to prevent HIV, you should first get tested for HIV. You should then be tested every 3 months for as long as you are taking the medicine. Pregnancy If you are about to stop having your period (premenopausal) and you may become pregnant, seek counseling before you get pregnant. Take 400 to 800 micrograms (mcg) of folic acid every day if you become pregnant. Ask for birth control (contraception) if you want to prevent pregnancy. Osteoporosis and menopause Osteoporosis is a disease in which the bones lose minerals and strength with aging. This can result in bone fractures. If you are 93 years old or older, or if you are at risk for osteoporosis and fractures, ask your health care provider if you should: Be screened for bone loss. Take a calcium or vitamin D supplement to lower your risk of fractures. Be given hormone replacement therapy (HRT) to treat symptoms of menopause. Follow these instructions at home: Alcohol use Do not drink alcohol if: Your health care provider tells you not to drink. You are pregnant, may be pregnant, or are planning to become pregnant. If you drink alcohol: Limit how much you have to: 0-1 drink a day. Know how much alcohol is in your drink. In the [place] one drink equals one 12 oz bottle of beer (355 mL), one 5 oz glass of wine (148 mL), or one 1 oz glass of hard liquor (44 mL). Lifestyle Do not use any  products that contain nicotine or tobacco. These products include cigarettes, chewing tobacco, and vaping devices, such as e-cigarettes. If you need help quitting, ask your health care provider. Do not use street drugs. Do not share needles. Ask your health care provider for help if you need support or information about quitting drugs. General instructions Schedule regular health, dental, and eye exams. Stay current with your vaccines. Tell your health care provider if: You often feel depressed. You have ever been abused or do not feel safe at home. Summary Adopting a healthy lifestyle and getting preventive care are important in promoting health and wellness. Follow your health care provider's instructions about healthy diet, exercising, and getting tested or screened for diseases. Follow your health care provider's instructions on monitoring your cholesterol and blood pressure. This information is not intended to replace advice given to you by your health care provider. Make sure you discuss any questions you have with your health care provider. Document Revised: 07/08/2020 Document Reviewed: 07/08/2020 Elsevier Patient Education  2024 [employer].

## 2022-10-21 NOTE — Assessment & Plan Note (Signed)
We discussed the importance of regular physical activity and healthy diet for prevention of chronic illness and/or complications. Preventive guidelines reviewed. Vaccination up to date. Continue her female preventive care with her gyn. Next CPE in a year. 

## 2022-10-21 NOTE — Assessment & Plan Note (Signed)
Recommend trying Topamax at bedtime.

## 2022-10-22 LAB — BASIC METABOLIC PANEL
BUN: 11 mg/dL (ref 6–23)
CO2: 23 meq/L (ref 19–32)
Calcium: 9.3 mg/dL (ref 8.4–10.5)
Chloride: 106 meq/L (ref 96–112)
Creatinine, Ser: 0.81 mg/dL (ref 0.40–1.20)
GFR: 95.33 mL/min (ref >60.00)
Glucose, Bld: 69 mg/dL — ABNORMAL LOW (ref 70–99)
Potassium: 3.4 meq/L — ABNORMAL LOW (ref 3.5–5.1)
Sodium: 140 meq/L (ref 135–145)

## 2022-10-22 LAB — HEMOGLOBIN A1C: Hgb A1c MFr Bld: 4.6 % (ref 4.6–6.5)

## 2022-10-22 LAB — HEPATITIS C ANTIBODY: Hepatitis C Ab: NONREACTIVE

## 2022-10-22 LAB — MEASLES/MUMPS/RUBELLA IMMUNITY
Mumps IgG: 78 [AU]/ml
Rubella: 9.41 {index}
Rubeola IgG: 18.4 [AU]/ml

## 2022-10-23 LAB — TSH: TSH: 1.22 u[IU]/mL (ref 0.35–5.50)

## 2022-10-27 ENCOUNTER — Other Ambulatory Visit: Payer: Self-pay

## 2022-10-27 MED ORDER — AMLODIPINE-VALSARTAN-HCTZ 5-160-12.5 MG PO TABS: 1.0000 | ORAL_TABLET | Freq: Every day | ORAL | 3 refills | Status: AC

## 2022-11-24 ENCOUNTER — Other Ambulatory Visit: Payer: Self-pay

## 2022-11-24 ENCOUNTER — Encounter: Payer: Self-pay | Admitting: Family Medicine

## 2022-11-24 DIAGNOSIS — Z789 Other specified health status: Secondary | ICD-10-CM

## 2022-11-25 ENCOUNTER — Ambulatory Visit: Payer: Medicaid Other

## 2022-11-30 ENCOUNTER — Other Ambulatory Visit (INDEPENDENT_AMBULATORY_CARE_PROVIDER_SITE_OTHER): Payer: Medicaid Other

## 2022-11-30 DIAGNOSIS — Z789 Other specified health status: Secondary | ICD-10-CM | POA: Diagnosis not present

## 2022-12-01 LAB — HEPATITIS B SURFACE ANTIBODY,QUALITATIVE: Hep B S Ab: REACTIVE — AB

## 2022-12-01 LAB — HEPATITIS B SURFACE ANTIGEN: Hepatitis B Surface Ag: NONREACTIVE

## 2022-12-29 NOTE — Progress Notes (Incomplete)
   ACUTE VISIT No chief complaint on file.  HPI: Yvette Trevino is a 32 y.o. female with a PMHx significant for migraines, anxiety, depression, and chronic left knee pain, who is here today complaining of stress at school.  HPI  Review of Systems See other pertinent positives and negatives in HPI.  Current Outpatient Medications on File Prior to Visit  Medication Sig Dispense Refill   amLODIPine-Valsartan-HCTZ 5-160-12.5 MG TABS Take 1 tablet by mouth daily. 90 tablet 3   diclofenac (VOLTAREN) 75 MG EC tablet Take 1 tablet (75 mg total) by mouth 2 (two) times daily as needed. 60 tablet 1   fluconazole (DIFLUCAN) 150 MG tablet Take 1 tablet (150 mg total) by mouth daily. (Patient not taking: Reported on 10/21/2022) 1 tablet 11   Prenatal Vit-Fe Fumarate-FA (PRENATAL MULTIVITAMIN) TABS tablet Take 1 tablet by mouth daily at 12 noon. (Patient not taking: Reported on 10/21/2022)     No current facility-administered medications on file prior to visit.    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  Tobacco Use   Smoking status: Never   Smokeless tobacco: Never  Substance and Sexual Activity   Alcohol use: Yes    Alcohol/week: 1.0 standard drink of alcohol    Types: 1 Standard drinks or equivalent per week   Drug use: No   Sexual activity: Yes    Birth control/protection: None  Other Topics Concern   Not on file  Social History Narrative   Not on file   Social Determinants of Health   Financial Resource Strain: Not on file  Food Insecurity: Not on  file  Transportation Needs: Not on file  Physical Activity: Not on file  Stress: Not on file  Social Connections: Not on file    There were no vitals filed for this visit. There is no height or weight on file to calculate BMI.  Physical Exam  ASSESSMENT AND PLAN:  [name] was seen today for stress at school.   There are no diagnoses linked to this encounter.  No follow-ups on file.  I, Suanne Marker, acting as a scribe for Betty [place], MD., have documented all relevant documentation on the behalf of Betty [place], MD, as directed by  Betty [place], MD while in the presence of Betty [place], MD.   I, Suanne Marker, have reviewed all documentation for this visit. The documentation on 12/29/22 for the exam, diagnosis, procedures, and orders are all accurate and complete.  Betty G. [place], MD  [facility]. [place] [facility].  Discharge Instructions   None

## 2022-12-30 ENCOUNTER — Ambulatory Visit: Payer: Medicaid Other | Admitting: Family Medicine

## 2023-01-01 ENCOUNTER — Ambulatory Visit: Payer: Medicaid Other | Admitting: Family Medicine

## 2023-01-11 ENCOUNTER — Ambulatory Visit: Payer: Medicaid Other | Admitting: Family Medicine

## 2023-01-11 VITALS — BP 100/70 | HR 98 | Temp 98.7°F | Resp 12 | Ht 61.02 in | Wt 159.1 lb

## 2023-01-11 DIAGNOSIS — F419 Anxiety disorder, unspecified: Secondary | ICD-10-CM

## 2023-01-11 DIAGNOSIS — F5105 Insomnia due to other mental disorder: Secondary | ICD-10-CM | POA: Diagnosis not present

## 2023-01-11 DIAGNOSIS — R41840 Attention and concentration deficit: Secondary | ICD-10-CM

## 2023-01-11 DIAGNOSIS — G4700 Insomnia, unspecified: Secondary | ICD-10-CM | POA: Insufficient documentation

## 2023-01-11 DIAGNOSIS — F331 Major depressive disorder, recurrent, moderate: Secondary | ICD-10-CM | POA: Diagnosis not present

## 2023-01-11 DIAGNOSIS — F99 Mental disorder, not otherwise specified: Secondary | ICD-10-CM

## 2023-01-11 MED ORDER — BUPROPION HCL ER (XL) 150 MG PO TB24
150.0000 mg | ORAL_TABLET | Freq: Every day | ORAL | 1 refills | Status: AC
Start: 2023-01-11 — End: ?

## 2023-01-11 MED ORDER — TRAZODONE HCL 50 MG PO TABS
50.0000 mg | ORAL_TABLET | Freq: Every day | ORAL | 1 refills | Status: AC
Start: 2023-01-11 — End: ?

## 2023-01-11 NOTE — Patient Instructions (Addendum)
A few things to remember from today's visit:  Difficulty concentrating - Plan: Ambulatory referral to Psychiatry, buPROPion (WELLBUTRIN XL) 150 MG 24 hr tablet  Depression, major, recurrent, moderate (HCC) - Plan: Ambulatory referral to Psychiatry, buPROPion (WELLBUTRIN XL) 150 MG 24 hr tablet, traZODone (DESYREL) 50 MG tablet  Insomnia due to other mental disorder - Plan: Ambulatory referral to Psychiatry, traZODone (DESYREL) 50 MG tablet  Anxiety disorder, unspecified type - Plan: traZODone (DESYREL) 50 MG tablet  Wellbutrin daily in the morning and Trazodone 30 min before bedtime, start with 1/2 tab of trazodone and increase to whole tab in 7 days of still not sleeping 7 hours. Call psychiatrist's office, if you cannot be seen in 6 weeks, I need to see you and see if meds are helping. Try to obtain past records.  Do not use My Chart to request refills or for acute issues that need immediate attention. If you send a my chart message, it may take a few days to be addressed, specially if I am not in the office.  Please be sure medication list is accurate. If a new problem present, please set up appointment sooner than planned today.

## 2023-01-11 NOTE — Progress Notes (Unsigned)
ACUTE VISIT Chief Complaint  Patient presents with   Stress   HPI: [name] is a 32 y.o. female with a PMHx significant for migraines, anxiety, depression, and chronic left knee pain, who is here today complaining of stress at school.   She is complains she is having difficulty focusing lecture and studying for school.  She mentions she was dx'ed with "little" ADHD in high school, but has been able to manage it without medication until recently.  She also mentions she has had  anxiety and depression since her husband left, about 5 years ago. She took Celexa in the past. She is not interested in daily medication. .  She attending school full time, also working part time, and has two kids.   She reports she has significant difficulty sleeping since  middle school. She says she has slept about one hour per night over the last 3 days. She has never tried medication for sleep.  She denies SI/HI. She has seen a psychiatrist in the past.  No known hx of bipolar disorder.  Lab Results  Component Value Date   TSH 1.22 10/21/2022       01/11/2023   11:19 AM 07/25/2019    3:07 PM  Depression screen PHQ 2/9  Decreased Interest 1 2  Down, Depressed, Hopeless 1 2  PHQ - 2 Score 2 4  Altered sleeping 3 3  Tired, decreased energy 3 3  Change in appetite 3 3  Feeling bad or failure about yourself  2 2  Trouble concentrating 3 2  Moving slowly or fidgety/restless 3 1  Suicidal thoughts 0 0  PHQ-9 Score 19 18  Difficult doing work/chores Very difficult Not difficult at all      01/11/2023   11:19 AM 07/25/2019    3:09 PM  GAD 7 : Generalized Anxiety Score  Nervous, Anxious, on Edge 2 2  Control/stop worrying 2 3  Worry too much - different things 2 3  Trouble relaxing 1 2  Restless 3 3  Easily annoyed or irritable 1 3  Afraid - awful might happen 1 2  Total GAD 7 Score 12 18  Anxiety Difficulty Somewhat difficult Not difficult at all   She is no longer taking  amlodipine-valsartan-hydrochlorothiazide 5-160-12.5 mg for HTN. Reports that her BP has been in normal range.She would like to keep med on her medication list in case she needs it in the future.  Review of Systems  Constitutional:  Positive for fatigue. Negative for chills and fever.  Respiratory:  Negative for cough and shortness of breath.   Cardiovascular:  Negative for chest pain and palpitations.  Gastrointestinal:  Negative for abdominal pain, nausea and vomiting.  Endocrine: Negative for cold intolerance and heat intolerance.  Neurological:  Negative for syncope and weakness.  Psychiatric/Behavioral:  Negative for confusion and hallucinations.   See other pertinent positives and negatives in HPI.  Current Outpatient Medications on File Prior to Visit  Medication Sig Dispense Refill   amLODIPine-Valsartan-HCTZ 5-160-12.5 MG TABS Take 1 tablet by mouth daily. 90 tablet 3   diclofenac (VOLTAREN) 75 MG EC tablet Take 1 tablet (75 mg total) by mouth 2 (two) times daily as needed. 60 tablet 1   fluconazole (DIFLUCAN) 150 MG tablet Take 1 tablet (150 mg total) by mouth daily. (Patient not taking: Reported on 10/21/2022) 1 tablet 11   Prenatal Vit-Fe Fumarate-FA (PRENATAL MULTIVITAMIN) TABS tablet Take 1 tablet by mouth daily at 12 noon. (Patient not taking: Reported on 10/21/2022)  No current facility-administered medications on file prior to visit.   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  Tobacco Use   Smoking status: Never    Smokeless tobacco: Never  Substance and Sexual Activity   Alcohol use: Yes    Alcohol/week: 1.0 standard drink of alcohol    Types: 1 Standard drinks or equivalent per week   Drug use: No   Sexual activity: Yes    Birth control/protection: None  Other Topics Concern   Not on file  Social History Narrative   Not on file   Social Determinants of Health   Financial Resource Strain: Low Risk  (01/11/2023)   Overall Financial Resource Strain (CARDIA)    Difficulty of Paying Living Expenses: Not very hard  Food Insecurity: No Food Insecurity (01/11/2023)   Hunger Vital Sign    Worried About Running Out of Food in the Last Year: Never true    Ran Out of Food in the Last Year: Never true  Transportation Needs: No Transportation Needs (01/11/2023)   PRAPARE - Administrator, Civil Service (Medical): No    Lack of Transportation (Non-Medical): No  Physical Activity: Insufficiently Active (01/11/2023)   Exercise Vital Sign    Days of Exercise per Week: 1 day    Minutes of Exercise per Session: 20 min  Stress: Stress Concern Present (01/11/2023)   [employer] of Occupational Health - Occupational Stress Questionnaire    Feeling of Stress : Rather much  Social Connections: Moderately Integrated (01/11/2023)   Social Connection and Isolation Panel [NHANES]    Frequency of Communication with Friends and Family: More than three times a week    Frequency of Social Gatherings with Friends and Family: Twice a week    Attends Religious Services: More than 4 times per year    Active Member of [employer] or Organizations: No    Attends Banker Meetings: Not on file    Marital Status: Married   Vitals:   01/11/23 1105  BP: 100/70  Pulse: 98  Resp: 12  Temp: 98.7 F (37.1 C)  SpO2: 97%   Body mass index is 30.05 kg/m.  Physical Exam Vitals and nursing note reviewed.  Constitutional:      General: She is not in acute distress.    Appearance: She is  well-developed.  HENT:     Head: Normocephalic and atraumatic.  Eyes:     Conjunctiva/sclera: Conjunctivae normal.  Cardiovascular:     Rate and Rhythm: Normal rate and regular rhythm.     Heart sounds: No murmur heard. Pulmonary:     Effort: Pulmonary effort is normal. No respiratory distress.     Breath sounds: Normal breath sounds.  Abdominal:     Palpations: Abdomen is soft. There is no hepatomegaly or mass.     Tenderness: There is no abdominal tenderness.  Skin:    General: Skin is warm.  Findings: No erythema or rash.  Neurological:     General: No focal deficit present.     Mental Status: She is alert and oriented to person, place, and time.     Cranial Nerves: No cranial nerve deficit.     Gait: Gait normal.  Psychiatric:        Mood and Affect: Affect normal. Mood is anxious.        Thought Content: Thought content does not include homicidal or suicidal ideation. Thought content does not include homicidal or suicidal plan.   ASSESSMENT AND PLAN:  Ms. Gerich was seen today for stress at school.   Insomnia due to other mental disorder Assessment & Plan: We discussed possible etiologies, most likely related to anxiety and depression. Trazodone 50 mg started today, 1/2 tab around bedtime and can increase to 1 tab in 7 days if well tolerated. Side effects discussed. Good sleep hygiene also recommended. F/U in 6 weeks.  Orders: -     Ambulatory referral to Psychiatry -     traZODone HCl; Take 1 tablet (50 mg total) by mouth at bedtime.  Dispense: 30 tablet; Refill: 1  Difficulty concentrating Reports Dx of mild ADHD during high school, she thinks she may be able to get records. Anxiety,depression,and insomnia can also be contributing factors. Wellbutrin XL started today for depression may help. Psychiatry referral placed to be re-evaluated for ADHD.  -     Ambulatory referral to Psychiatry -     buPROPion HCl ER (XL); Take 1 tablet (150 mg total) by mouth daily.   Dispense: 30 tablet; Refill: 1  Depression, major, recurrent, moderate (HCC) Assessment & Plan: Problem is not well controlled. We discussed treatment options. She agrees with trying Wellbutrin XL 150 mg daily. We discussed some side effects and she was instructed about warning signs. If not able to see psychiatrist in 6 weeks, I will see her here in the office.   Orders: -     Ambulatory referral to Psychiatry -     buPROPion HCl ER (XL); Take 1 tablet (150 mg total) by mouth daily.  Dispense: 30 tablet; Refill: 1 -     traZODone HCl; Take 1 tablet (50 mg total) by mouth at bedtime.  Dispense: 30 tablet; Refill: 1  Anxiety disorder, unspecified type Assessment & Plan: Trazodone started today to also help with sleep mainly. Referral to psychiatrist placed.  Orders: -     traZODone HCl; Take 1 tablet (50 mg total) by mouth at bedtime.  Dispense: 30 tablet; Refill: 1   Return in about 6 weeks (around 02/22/2023).  I, Rolla Etienne Wierda, acting as a scribe for Solange Emry [place], MD., have documented all relevant documentation on the behalf of Nadeem Romanoski [place], MD, as directed by  Ashleymarie Granderson [place], MD while in the presence of Jozef Eisenbeis [place], MD.   I, Niobe Dick [place], MD, have reviewed all documentation for this visit. The documentation on 01/14/23 for the exam, diagnosis, procedures, and orders are all accurate and complete.  Geral Coker G. [place], MD  [facility]. [place] office.

## 2023-01-14 NOTE — Assessment & Plan Note (Signed)
Trazodone started today to also help with sleep mainly. Referral to psychiatrist placed.

## 2023-01-14 NOTE — Assessment & Plan Note (Signed)
We discussed possible etiologies, most likely related to anxiety and depression. Trazodone 50 mg started today, 1/2 tab around bedtime and can increase to 1 tab in 7 days if well tolerated. Side effects discussed. Good sleep hygiene also recommended. F/U in 6 weeks.

## 2023-01-14 NOTE — Assessment & Plan Note (Signed)
Problem is not well controlled. We discussed treatment options. She agrees with trying Wellbutrin XL 150 mg daily. We discussed some side effects and she was instructed about warning signs. If not able to see psychiatrist in 6 weeks, I will see her here in the office.

## 2023-09-13 ENCOUNTER — Encounter: Payer: Self-pay | Admitting: Family Medicine

## 2023-09-13 ENCOUNTER — Ambulatory Visit: Payer: Self-pay

## 2023-09-13 ENCOUNTER — Ambulatory Visit (INDEPENDENT_AMBULATORY_CARE_PROVIDER_SITE_OTHER): Admitting: Family Medicine

## 2023-09-13 VITALS — BP 114/76 | HR 85 | Temp 98.2°F | Wt 153.1 lb

## 2023-09-13 DIAGNOSIS — L02211 Cutaneous abscess of abdominal wall: Secondary | ICD-10-CM | POA: Diagnosis not present

## 2023-09-13 DIAGNOSIS — Z111 Encounter for screening for respiratory tuberculosis: Secondary | ICD-10-CM

## 2023-09-13 NOTE — Telephone Encounter (Signed)
 FYI Only or Action Required?: FYI only for provider.  Patient was last seen in primary care on 01/11/2023 by [place], Betty G, MD.  Called Nurse Triage reporting Cyst.  Symptoms began several days ago.  Interventions attempted: Ice/heat application.  Symptoms are: gradually worsening.  Triage Disposition: See Physician Within 24 Hours  Patient/caregiver understands and will follow disposition?: Yes       Copied from CRM [id number]. Topic: Clinical - Red Word Triage >> Sep 13, 2023 10:21 AM Lavanda D wrote: Red Word that prompted transfer to Nurse Triage: Pain: Cyst on lower part of stomach, has been here for a week and has grown in size + pain. Patient said it is also red and possibly swollen, not sure if it's swollen or if it's just the cyst.    ----------------------------------------------------------------------- From previous Reason for Contact - Scheduling: Patient/patient representative is calling to schedule an appointment. Refer to attachments for appointment information. Reason for Disposition  [1] Boil > 1/2 inch across (> 12 mm; larger than a marble) AND [2] center is soft or pus colored  Answer Assessment - Initial Assessment Questions 1. APPEARANCE of BOIL: What does the boil look like?      Red around and soft in inside but hard on outside 2. LOCATION: Where is the boil located?      abd 3. NUMBER: How many boils are there?      1 4. SIZE: How big is the boil? (e.g., inches, cm; compare to size of a coin or other object)     Quarter size 5. ONSET: When did the boil start?     Thursday 6. PAIN: Is there any pain? If Yes, ask: How bad is the pain?   (Scale 1-10; or mild, moderate, severe)     no 7. FEVER: Do you have a fever? If Yes, ask: What is it, how was it measured, and when did it start?      no 8. SOURCE: Have you been around anyone with boils or other Staph infections? Have you ever had boils before?     no 9. OTHER SYMPTOMS: Do  you have any other symptoms? (e.g., shaking chills, weakness, rash elsewhere on body)     no  Protocols used: Boil (Skin Abscess)-A-AH

## 2023-09-13 NOTE — Patient Instructions (Signed)
 Return in 2 days for packing removal.  Try to keep dressing dry as possible.

## 2023-09-13 NOTE — Progress Notes (Signed)
 Established Patient Office Visit  Subjective   Patient ID: Yvette Trevino, female    DOB: [birth year]  Age: 33 y.o. MRN: [id number]  Chief Complaint  Patient presents with   Mass    HPI   Yvette Trevino is here to evaluate possible abscess abdominal wall skin left lower abdomen.  She has history of migraine headaches, history of depression, otherwise healthy.  No history of diabetes.  She noticed last Thursday some cystic type swelling left lower abdominal region.  Slightly more red and swollen since then and slightly more tender.  No fevers or chills.  No drainage.  Past Medical History:  Diagnosis Date   Anxiety    Depression    UTI (urinary tract infection)    Vitamin D deficiency    Past Surgical History:  Procedure Laterality Date   CHOLECYSTECTOMY     NO PAST SURGERIES      reports that she has never smoked. She has never used smokeless tobacco. She reports current alcohol use of about 1.0 standard drink of alcohol per week. She reports that she does not use drugs. family history includes Breast cancer in her maternal grandmother; Cancer in her maternal grandmother; Hypertension in her mother; Thyroid  cancer in her mother. Allergies  Allergen Reactions   Latex     Review of Systems  Constitutional:  Negative for chills and fever.      Objective:     BP 114/76 (BP Location: Left Arm, Patient Position: Sitting, Cuff Size: Normal)   Pulse 85   Temp 98.2 F (36.8 C) (Oral)   Wt 153 lb 1.6 oz (69.4 kg)   SpO2 98%   BMI 28.91 kg/m    Physical Exam Vitals reviewed.  Constitutional:      General: She is not in acute distress.    Appearance: She is not ill-appearing.  Cardiovascular:     Rate and Rhythm: Normal rate and regular rhythm.  Skin:    Comments: Left lower abdomen reveals approximately 3 x 3 cm area of mild erythema.  Near the center there is some fluctuance and mild tenderness palpation.  Neurological:     Mental Status: She is alert.      No results  found for any visits on 09/13/23.    The ASCVD Risk score (Arnett DK, et al., 2019) failed to calculate for the following reasons:   The 2019 ASCVD risk score is only valid for ages 28 to 79    Assessment & Plan:   Problem List Items Addressed This Visit   None Visit Diagnoses       Screening-pulmonary TB    -  Primary     Tuberculosis screening       Relevant Orders   QuantiFERON-TB Gold Plus     Abscess of skin of abdomen         Patient seen with abscess abdominal wall left lower abdomen.  This looks like probably sebaceous cyst that became abscessed.  We saw small dimple in the skin which suggest sebaceous cyst .   fluctuant abscess and recommend I&D.  We discussed risk of incision and drainage including pain, minimal bleeding, low risk of scarring.  Patient consented.  -Skin prepped with Betadine - Local anesthesia with 1% plain Xylocaine  - Using #15 blade made approximately 1 and half centimeter linear incision over area of maximal fluctuance.  Expressed estimated few cc of pus.  Used curved hemostats to break up any deeper pockets. - Minimal bleeding.  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  QuantiFERON gold assay ordered  Return in about 2 days (around 09/15/2023).    Wolm Scarlet, MD

## 2023-09-15 ENCOUNTER — Ambulatory Visit: Payer: Self-pay | Admitting: Family Medicine

## 2023-09-15 ENCOUNTER — Encounter: Payer: Self-pay | Admitting: Family Medicine

## 2023-09-15 ENCOUNTER — Ambulatory Visit (INDEPENDENT_AMBULATORY_CARE_PROVIDER_SITE_OTHER): Admitting: Family Medicine

## 2023-09-15 VITALS — BP 114/80 | HR 74 | Temp 98.4°F | Wt 156.1 lb

## 2023-09-15 DIAGNOSIS — L02211 Cutaneous abscess of abdominal wall: Secondary | ICD-10-CM

## 2023-09-15 LAB — QUANTIFERON-TB GOLD PLUS
Mitogen-NIL: >10 [IU]/mL
NIL: 0.01 [IU]/mL
QuantiFERON-TB Gold Plus: NEGATIVE
TB1-NIL: 0.04 [IU]/mL
TB2-NIL: 0.04 [IU]/mL

## 2023-09-15 NOTE — Patient Instructions (Signed)
 Clean daily with soap and water  Keep covered until skin seals over and drainage stops.    Follow up as needed.

## 2023-09-15 NOTE — Progress Notes (Unsigned)
   Established Patient Office Visit  Subjective   Patient ID: Shaine Mount, female    DOB: [birth year]  Age: 33 y.o. MRN: [id number]  Chief Complaint  Patient presents with   Medical Management of Chronic Issues    HPI  {History (Optional):23778} Cariah is here for abscess follow-up left abdomen.  Still has some mild discomfort.  Minimal drainage.  She has changed outer dressing couple times No fever  Past Medical History:  Diagnosis Date   Anxiety    Depression    UTI (urinary tract infection)    Vitamin D deficiency    Past Surgical History:  Procedure Laterality Date   CHOLECYSTECTOMY     NO PAST SURGERIES      reports that she has never smoked. She has never used smokeless tobacco. She reports current alcohol use of about 1.0 standard drink of alcohol per week. She reports that she does not use drugs. family history includes Breast cancer in her maternal grandmother; Cancer in her maternal grandmother; Hypertension in her mother; Thyroid  cancer in her mother. Allergies  Allergen Reactions   Latex     Review of Systems  Constitutional:  Negative for chills and fever.      Objective:     BP 114/80 (BP Location: Left Arm, Patient Position: Sitting, Cuff Size: Normal)   Pulse 74   Temp 98.4 F (36.9 C) (Oral)   Wt 156 lb 1.6 oz (70.8 kg)   SpO2 98%   BMI 29.48 kg/m  {Vitals History (Optional):23777}  Physical Exam Vitals reviewed.  Constitutional:      General: She is not in acute distress.    Appearance: She is not ill-appearing.  Cardiovascular:     Rate and Rhythm: Normal rate and regular rhythm.  Skin:    Comments: Bandage removed.  Packing removed.  No cellulitis changes.  No purulent drainage.  Wound cavity irrigated.  We could visualize some whitish-gray remnants of sebaceous cyst.  Used pickups and were able to gently remove portions of cyst sac.  No fluctuance.  Neurological:     Mental Status: She is alert.      No results found for any  visits on 09/15/23.  {Labs (Optional):23779}  The ASCVD Risk score (Arnett DK, et al., 2019) failed to calculate for the following reasons:   The 2019 ASCVD risk score is only valid for ages 63 to 65    Assessment & Plan:   Abscess sebaceous cyst of abdomen.  I&D 2 days ago.  No further purulence.  Overall doing well.  Packing left out at this point.  We did explain that sebaceous cyst can grow back with any remnant of sac left.  Recommend cleaning daily with soap and water.  Keep covered with dressing until skin has sealed over and quits draining.  Follow-up as needed  Wolm Scarlet, MD

## 2023-10-22 ENCOUNTER — Encounter: Payer: Medicaid Other | Admitting: Family Medicine

## 2023-10-22 DIAGNOSIS — Z0000 Encounter for general adult medical examination without abnormal findings: Secondary | ICD-10-CM
